# Patient Record
Sex: Male | Born: 1955 | Race: White | Hispanic: No | Marital: Married | Smoking: Former smoker
Health system: Southern US, Community
[De-identification: ages and names within clinical notes are randomized; demographics above are authoritative.]

## PROBLEM LIST (undated history)

## (undated) DIAGNOSIS — N400 Enlarged prostate without lower urinary tract symptoms: Secondary | ICD-10-CM

## (undated) DIAGNOSIS — M109 Gout, unspecified: Secondary | ICD-10-CM

## (undated) DIAGNOSIS — M1990 Unspecified osteoarthritis, unspecified site: Secondary | ICD-10-CM

## (undated) DIAGNOSIS — E785 Hyperlipidemia, unspecified: Secondary | ICD-10-CM

## (undated) DIAGNOSIS — M549 Dorsalgia, unspecified: Secondary | ICD-10-CM

## (undated) HISTORY — DX: Dorsalgia, unspecified: M54.9

## (undated) HISTORY — PX: COLONOSCOPY: SHX174

## (undated) HISTORY — DX: Gout, unspecified: M10.9

---

## 2002-04-23 HISTORY — PX: FOOT ARTHROPLASTY: SHX1657

## 2003-09-28 ENCOUNTER — Ambulatory Visit (HOSPITAL_COMMUNITY): Admission: RE | Admit: 2003-09-28 | Discharge: 2003-09-28 | Payer: Self-pay | Admitting: Anesthesiology

## 2009-06-19 ENCOUNTER — Emergency Department (HOSPITAL_COMMUNITY): Admission: EM | Admit: 2009-06-19 | Discharge: 2009-06-19 | Payer: Self-pay | Admitting: Emergency Medicine

## 2012-04-23 HISTORY — PX: WRIST ARTHRODESIS: SUR65

## 2012-10-22 ENCOUNTER — Other Ambulatory Visit: Payer: Self-pay | Admitting: Orthopedic Surgery

## 2012-10-22 DIAGNOSIS — M25532 Pain in left wrist: Secondary | ICD-10-CM

## 2012-10-30 ENCOUNTER — Ambulatory Visit
Admission: RE | Admit: 2012-10-30 | Discharge: 2012-10-30 | Disposition: A | Discharge status: Home / Self Care | Payer: Worker's Compensation | Source: Ambulatory Visit | Attending: Orthopedic Surgery | Admitting: Orthopedic Surgery

## 2012-10-30 ENCOUNTER — Other Ambulatory Visit: Payer: Self-pay | Admitting: Orthopedic Surgery

## 2012-10-30 DIAGNOSIS — M25532 Pain in left wrist: Secondary | ICD-10-CM

## 2012-10-30 DIAGNOSIS — Z77018 Contact with and (suspected) exposure to other hazardous metals: Secondary | ICD-10-CM

## 2012-10-30 MED ORDER — IOHEXOL 180 MG/ML  SOLN
3.0000 mL | Freq: Once | INTRAMUSCULAR | Status: AC | PRN
  Administered 2012-10-30: 3 mL via INTRA_ARTICULAR

## 2012-11-03 ENCOUNTER — Other Ambulatory Visit: Payer: Self-pay | Admitting: Orthopedic Surgery

## 2012-11-03 DIAGNOSIS — M25532 Pain in left wrist: Secondary | ICD-10-CM

## 2012-11-11 ENCOUNTER — Ambulatory Visit
Admission: RE | Admit: 2012-11-11 | Discharge: 2012-11-11 | Disposition: A | Discharge status: Home / Self Care | Payer: Worker's Compensation | Source: Ambulatory Visit | Attending: Orthopedic Surgery | Admitting: Orthopedic Surgery

## 2012-11-11 ENCOUNTER — Other Ambulatory Visit: Payer: Self-pay | Admitting: Orthopedic Surgery

## 2012-11-11 DIAGNOSIS — M25532 Pain in left wrist: Secondary | ICD-10-CM

## 2012-11-11 DIAGNOSIS — Z77018 Contact with and (suspected) exposure to other hazardous metals: Secondary | ICD-10-CM

## 2012-11-11 MED ORDER — IOHEXOL 180 MG/ML  SOLN
3.0000 mL | Freq: Once | INTRAMUSCULAR | Status: AC | PRN
  Administered 2012-11-11: 1.5 mL via INTRA_ARTICULAR

## 2013-01-19 ENCOUNTER — Other Ambulatory Visit: Payer: Self-pay | Admitting: Orthopedic Surgery

## 2013-02-12 ENCOUNTER — Encounter (HOSPITAL_BASED_OUTPATIENT_CLINIC_OR_DEPARTMENT_OTHER): Payer: Self-pay | Admitting: *Deleted

## 2013-02-12 NOTE — Progress Notes (Signed)
Pt denies any cardiac problems-or sleep apnea-does snore-had this wrist surgery [place] 1/14-did not have any resp problems post op

## 2013-02-12 NOTE — Progress Notes (Signed)
02/12/13 1443  OBSTRUCTIVE SLEEP APNEA  Have you ever been diagnosed with sleep apnea through a sleep study? No  Do you snore loudly (loud enough to be heard through closed doors)?  1  Do you often feel tired, fatigued, or sleepy during the daytime? 0  Has anyone observed you stop breathing during your sleep? 0  Do you have, or are you being treated for high blood pressure? 0  BMI more than 35 kg/m2? 1  Age over 55 years old? 1  Neck circumference greater than 40 cm/18 inches? (does not know)  Gender: 1  Obstructive Sleep Apnea Score 4  Score 4 or greater  Results sent to PCP

## 2013-02-17 ENCOUNTER — Ambulatory Visit (HOSPITAL_BASED_OUTPATIENT_CLINIC_OR_DEPARTMENT_OTHER)
Admission: RE | Admit: 2013-02-17 | Discharge: 2013-02-17 | Disposition: A | Discharge status: Home / Self Care | Payer: Worker's Compensation | Source: Ambulatory Visit | Attending: Orthopedic Surgery | Admitting: Orthopedic Surgery

## 2013-02-17 ENCOUNTER — Ambulatory Visit (HOSPITAL_BASED_OUTPATIENT_CLINIC_OR_DEPARTMENT_OTHER): Payer: Worker's Compensation | Admitting: Certified Registered Nurse Anesthetist

## 2013-02-17 ENCOUNTER — Encounter (HOSPITAL_BASED_OUTPATIENT_CLINIC_OR_DEPARTMENT_OTHER)
Admission: RE | Disposition: A | Discharge status: Home / Self Care | Payer: Self-pay | Source: Ambulatory Visit | Attending: Orthopedic Surgery

## 2013-02-17 ENCOUNTER — Encounter (HOSPITAL_BASED_OUTPATIENT_CLINIC_OR_DEPARTMENT_OTHER): Payer: Worker's Compensation | Admitting: Certified Registered Nurse Anesthetist

## 2013-02-17 ENCOUNTER — Encounter (HOSPITAL_BASED_OUTPATIENT_CLINIC_OR_DEPARTMENT_OTHER): Payer: Self-pay | Admitting: Orthopedic Surgery

## 2013-02-17 DIAGNOSIS — S42309S Unspecified fracture of shaft of humerus, unspecified arm, sequela: Secondary | ICD-10-CM | POA: Insufficient documentation

## 2013-02-17 DIAGNOSIS — IMO0002 Reserved for concepts with insufficient information to code with codable children: Secondary | ICD-10-CM | POA: Insufficient documentation

## 2013-02-17 DIAGNOSIS — M24639 Ankylosis, unspecified wrist: Secondary | ICD-10-CM | POA: Insufficient documentation

## 2013-02-17 HISTORY — PX: WRIST ARTHROSCOPY: SHX838

## 2013-02-17 HISTORY — PX: ULNA OSTEOTOMY: SHX1077

## 2013-02-17 HISTORY — DX: Hyperlipidemia, unspecified: E78.5

## 2013-02-17 LAB — POCT HEMOGLOBIN-HEMACUE: Hemoglobin: 13.8 g/dL (ref 13.0–17.0)

## 2013-02-17 SURGERY — ARTHROSCOPY, WRIST
Anesthesia: General | Site: Wrist | Laterality: Left | Wound class: Clean

## 2013-02-17 MED ORDER — FENTANYL CITRATE 0.05 MG/ML IJ SOLN
INTRAMUSCULAR | Status: AC
  Filled 2013-02-17: qty 2

## 2013-02-17 MED ORDER — MIDAZOLAM HCL 2 MG/2ML IJ SOLN
INTRAMUSCULAR | Status: AC
  Filled 2013-02-17: qty 2

## 2013-02-17 MED ORDER — CHLORHEXIDINE GLUCONATE 4 % EX LIQD: 60.0000 mL | Freq: Once | CUTANEOUS | Status: DC

## 2013-02-17 MED ORDER — CEFAZOLIN SODIUM-DEXTROSE 2-3 GM-% IV SOLR
INTRAVENOUS | Status: AC
  Filled 2013-02-17: qty 50

## 2013-02-17 MED ORDER — OXYCODONE HCL 5 MG/5ML PO SOLN: 5.0000 mg | Freq: Once | ORAL | Status: AC | PRN

## 2013-02-17 MED ORDER — CEFAZOLIN SODIUM-DEXTROSE 2-3 GM-% IV SOLR
2.0000 g | INTRAVENOUS | Status: AC
  Administered 2013-02-17: 2 g via INTRAVENOUS

## 2013-02-17 MED ORDER — OXYCODONE HCL 5 MG PO TABS
10.0000 mg | ORAL_TABLET | Freq: Once | ORAL | Status: AC | PRN
  Administered 2013-02-17: 10 mg via ORAL

## 2013-02-17 MED ORDER — OXYCODONE-ACETAMINOPHEN 10-325 MG PO TABS: 1.0000 | ORAL_TABLET | ORAL | Status: DC | PRN

## 2013-02-17 MED ORDER — OXYCODONE HCL 5 MG PO TABS
ORAL_TABLET | ORAL | Status: AC
  Filled 2013-02-17: qty 2

## 2013-02-17 MED ORDER — FENTANYL CITRATE 0.05 MG/ML IJ SOLN
INTRAMUSCULAR | Status: AC
  Filled 2013-02-17: qty 6

## 2013-02-17 MED ORDER — ONDANSETRON HCL 4 MG/2ML IJ SOLN: 4.0000 mg | Freq: Once | INTRAMUSCULAR | Status: DC | PRN

## 2013-02-17 MED ORDER — OXYCODONE HCL 5 MG/5ML PO SOLN: 5.0000 mg | Freq: Once | ORAL | Status: DC | PRN

## 2013-02-17 MED ORDER — DEXAMETHASONE SODIUM PHOSPHATE 4 MG/ML IJ SOLN
INTRAMUSCULAR | Status: DC | PRN
  Administered 2013-02-17: 4 mg

## 2013-02-17 MED ORDER — MIDAZOLAM HCL 2 MG/2ML IJ SOLN
1.0000 mg | INTRAMUSCULAR | Status: DC | PRN
  Administered 2013-02-17 (×2): 2 mg via INTRAVENOUS

## 2013-02-17 MED ORDER — HYDROMORPHONE HCL PF 1 MG/ML IJ SOLN
0.2500 mg | INTRAMUSCULAR | Status: DC | PRN
  Administered 2013-02-17 (×2): 0.5 mg via INTRAVENOUS

## 2013-02-17 MED ORDER — MIDAZOLAM HCL 5 MG/5ML IJ SOLN
INTRAMUSCULAR | Status: DC | PRN
  Administered 2013-02-17: 1 mg via INTRAVENOUS

## 2013-02-17 MED ORDER — FENTANYL CITRATE 0.05 MG/ML IJ SOLN
50.0000 ug | INTRAMUSCULAR | Status: DC | PRN
  Administered 2013-02-17: 100 ug via INTRAVENOUS

## 2013-02-17 MED ORDER — OXYCODONE HCL 5 MG PO TABS: 5.0000 mg | ORAL_TABLET | Freq: Once | ORAL | Status: DC | PRN

## 2013-02-17 MED ORDER — LACTATED RINGERS IV SOLN
INTRAVENOUS | Status: DC
  Administered 2013-02-17 (×2): via INTRAVENOUS

## 2013-02-17 MED ORDER — DEXAMETHASONE SODIUM PHOSPHATE 10 MG/ML IJ SOLN
INTRAMUSCULAR | Status: DC | PRN
  Administered 2013-02-17: 10 mg via INTRAVENOUS

## 2013-02-17 MED ORDER — FENTANYL CITRATE 0.05 MG/ML IJ SOLN
INTRAMUSCULAR | Status: AC
  Filled 2013-02-17: qty 4

## 2013-02-17 MED ORDER — PROPOFOL 10 MG/ML IV BOLUS
INTRAVENOUS | Status: AC
  Filled 2013-02-17: qty 20

## 2013-02-17 MED ORDER — BUPIVACAINE-EPINEPHRINE PF 0.5-1:200000 % IJ SOLN
INTRAMUSCULAR | Status: DC | PRN
  Administered 2013-02-17: 24 mL

## 2013-02-17 MED ORDER — HYDROMORPHONE HCL PF 1 MG/ML IJ SOLN
INTRAMUSCULAR | Status: AC
  Filled 2013-02-17: qty 1

## 2013-02-17 MED ORDER — FENTANYL CITRATE 0.05 MG/ML IJ SOLN
INTRAMUSCULAR | Status: DC | PRN
  Administered 2013-02-17 (×2): 25 ug via INTRAVENOUS
  Administered 2013-02-17: 50 ug via INTRAVENOUS
  Administered 2013-02-17: 25 ug via INTRAVENOUS

## 2013-02-17 MED ORDER — PROPOFOL 10 MG/ML IV BOLUS
INTRAVENOUS | Status: DC | PRN
  Administered 2013-02-17: 240 mg via INTRAVENOUS

## 2013-02-17 MED ORDER — LIDOCAINE HCL (CARDIAC) 20 MG/ML IV SOLN
INTRAVENOUS | Status: DC | PRN
  Administered 2013-02-17: 30 mg via INTRAVENOUS

## 2013-02-17 SURGICAL SUPPLY — 98 items
BANDAGE GAUZE ELAST BULKY 4 IN (GAUZE/BANDAGES/DRESSINGS) ×2 IMPLANT
BIT DRILL 2.8X5 QR DISP (BIT) ×1 IMPLANT
BIT DRILL QUICK RELEASE 3.5MM (BIT) IMPLANT
BLADE AVERAGE 25X9 (BLADE) ×2 IMPLANT
BLADE CUDA 2.0 (BLADE) IMPLANT
BLADE EAR TYMPAN 2.5 60D BEAV (BLADE) IMPLANT
BLADE MINI RND TIP GREEN BEAV (BLADE) IMPLANT
BLADE SAW OSTEOTOMY (BLADE) ×1 IMPLANT
BLADE SURG 15 STRL LF DISP TIS (BLADE) ×1 IMPLANT
BLADE SURG 15 STRL SS (BLADE) ×2
BNDG CMPR 9X4 STRL LF SNTH (GAUZE/BANDAGES/DRESSINGS) ×1
BNDG COHESIVE 3X5 TAN STRL LF (GAUZE/BANDAGES/DRESSINGS) ×4 IMPLANT
BNDG ESMARK 4X9 LF (GAUZE/BANDAGES/DRESSINGS) ×2 IMPLANT
BONE EVOLUTION TRINITY 1CC (Bone Implant) ×1 IMPLANT
BUR CUDA 2.9 (BURR) IMPLANT
BUR EGG 3PK/BX (BURR) IMPLANT
BUR FULL RADIUS 2.0 (BURR) IMPLANT
BUR FULL RADIUS 2.9 (BURR) ×1 IMPLANT
BUR GATOR 2.9 (BURR) IMPLANT
BUR SPHERICAL 2.9 (BURR) IMPLANT
CANISTER OMNI JUG 16 LITER (MISCELLANEOUS) IMPLANT
CANISTER SUCT 1200ML W/VALVE (MISCELLANEOUS) ×1 IMPLANT
CANISTER SUCT 3000ML (MISCELLANEOUS) IMPLANT
CHLORAPREP W/TINT 26ML (MISCELLANEOUS) ×2 IMPLANT
CORDS BIPOLAR (ELECTRODE) ×2 IMPLANT
COVER MAYO STAND STRL (DRAPES) ×2 IMPLANT
COVER TABLE BACK 60X90 (DRAPES) ×2 IMPLANT
CUFF TOURNIQUET SINGLE 18IN (TOURNIQUET CUFF) ×1 IMPLANT
DECANTER SPIKE VIAL GLASS SM (MISCELLANEOUS) IMPLANT
DRAIN PENROSE 1/2X12 LTX STRL (WOUND CARE) IMPLANT
DRAPE EXTREMITY T 121X128X90 (DRAPE) ×2 IMPLANT
DRAPE OEC MINIVIEW 54X84 (DRAPES) ×2 IMPLANT
DRAPE SURG 17X23 STRL (DRAPES) ×2 IMPLANT
DRAPE U 20/CS (DRAPES) ×2 IMPLANT
DRILL QUICK RELEASE 3.5MM (BIT) ×2
DRSG KUZMA FLUFF (GAUZE/BANDAGES/DRESSINGS) IMPLANT
ELECT SMALL JOINT 90D BASC (ELECTRODE) IMPLANT
GAUZE SPONGE 4X4 16PLY XRAY LF (GAUZE/BANDAGES/DRESSINGS) ×2 IMPLANT
GAUZE XEROFORM 1X8 LF (GAUZE/BANDAGES/DRESSINGS) ×2 IMPLANT
GLOVE BIO SURGEON STRL SZ 6.5 (GLOVE) ×2 IMPLANT
GLOVE BIOGEL PI IND STRL 8.5 (GLOVE) ×1 IMPLANT
GLOVE BIOGEL PI INDICATOR 8.5 (GLOVE) ×1
GLOVE SURG ORTHO 8.0 STRL STRW (GLOVE) ×2 IMPLANT
GOWN BRE IMP PREV XXLGXLNG (GOWN DISPOSABLE) ×3 IMPLANT
GOWN PREVENTION PLUS XLARGE (GOWN DISPOSABLE) ×3 IMPLANT
GUIDEWIRE ORTHO 0.054X6 (WIRE) ×2 IMPLANT
KIT MINI BIO ANCHOR DRILL (KITS) IMPLANT
NDL EPIDURAL TUOHY 20GX3.5 (NEEDLE) IMPLANT
NDL SAFETY ECLIPSE 18X1.5 (NEEDLE) ×3 IMPLANT
NDL SPNL 18GX3.5 QUINCKE PK (NEEDLE) IMPLANT
NEEDLE HYPO 18GX1.5 SHARP (NEEDLE) ×2
NEEDLE HYPO 22GX1.5 SAFETY (NEEDLE) ×2 IMPLANT
NEEDLE SPNL 18GX3.5 QUINCKE PK (NEEDLE) IMPLANT
NEEDLE TUOHY 20GX3.5 (NEEDLE) IMPLANT
NS IRRIG 1000ML POUR BTL (IV SOLUTION) ×1 IMPLANT
PACK BASIN DAY SURGERY FS (CUSTOM PROCEDURE TRAY) ×2 IMPLANT
PAD CAST 3X4 CTTN HI CHSV (CAST SUPPLIES) ×1 IMPLANT
PAD CAST 4YDX4 CTTN HI CHSV (CAST SUPPLIES) ×1 IMPLANT
PADDING CAST ABS 3INX4YD NS (CAST SUPPLIES) ×1
PADDING CAST ABS 4INX4YD NS (CAST SUPPLIES) ×1
PADDING CAST ABS COTTON 3X4 (CAST SUPPLIES) ×1 IMPLANT
PADDING CAST ABS COTTON 4X4 ST (CAST SUPPLIES) ×1 IMPLANT
PADDING CAST COTTON 3X4 STRL (CAST SUPPLIES) ×2
PADDING CAST COTTON 4X4 STRL (CAST SUPPLIES) ×2
PLATE ULNAR SHORTENING (Plate) ×1 IMPLANT
ROUTER HOODED VORTEX 2.9MM (BLADE) IMPLANT
SCREW CANC 18MM (Screw) ×1 IMPLANT
SCREW HEXALOBE NON-LOCK 3.5X14 (Screw) ×2 IMPLANT
SCREW HEXALOBE NON-LOCK 3.5X16 (Screw) ×3 IMPLANT
SCREW NONLOCK HEX 3.5X12 (Screw) ×1 IMPLANT
SET ARTHROSCOPY TUBING (MISCELLANEOUS) ×2
SET ARTHROSCOPY TUBING LN (MISCELLANEOUS) IMPLANT
SET EXT MALE ROTATING LL 32IN (MISCELLANEOUS) ×2 IMPLANT
SET IV EXT TUBING FEMALE 31 (MISCELLANEOUS) ×1 IMPLANT
SET SM JOINT TUBING/CANN (CANNULA) IMPLANT
SLEEVE SCD COMPRESS KNEE MED (MISCELLANEOUS) ×1 IMPLANT
SLING ARM FOAM STRAP LRG (SOFTGOODS) IMPLANT
SLING ARM FOAM STRAP MED (SOFTGOODS) IMPLANT
SPLINT PLASTER CAST XFAST 3X15 (CAST SUPPLIES) IMPLANT
SPLINT PLASTER XTRA FASTSET 3X (CAST SUPPLIES) ×30
SPONGE GAUZE 4X4 12PLY (GAUZE/BANDAGES/DRESSINGS) ×2 IMPLANT
STOCKINETTE 4X48 STRL (DRAPES) ×2 IMPLANT
SUCTION FRAZIER TIP 10 FR DISP (SUCTIONS) IMPLANT
SUT MERSILENE 4 0 P 3 (SUTURE) IMPLANT
SUT PDS AB 2-0 CT2 27 (SUTURE) IMPLANT
SUT STEEL 4 0 (SUTURE) IMPLANT
SUT VIC AB 2-0 PS2 27 (SUTURE) ×2 IMPLANT
SUT VICRYL 4-0 PS2 18IN ABS (SUTURE) ×2 IMPLANT
SUT VICRYL RAPID 5 0 P 3 (SUTURE) IMPLANT
SUT VICRYL RAPIDE 4/0 PS 2 (SUTURE) ×2 IMPLANT
SYR BULB 3OZ (MISCELLANEOUS) ×2 IMPLANT
SYR CONTROL 10ML LL (SYRINGE) ×2 IMPLANT
TOWEL OR 17X24 6PK STRL BLUE (TOWEL DISPOSABLE) ×2 IMPLANT
TUBE CONNECTING 20X1/4 (TUBING) IMPLANT
UNDERPAD 30X30 INCONTINENT (UNDERPADS AND DIAPERS) ×2 IMPLANT
WAND 1.5 MICROBLATOR (SURGICAL WAND) IMPLANT
WATER STERILE IRR 1000ML POUR (IV SOLUTION) ×2 IMPLANT
WIRE TACK PLATE PL-PTACK (WIRE) ×1 IMPLANT

## 2013-02-17 NOTE — Op Note (Signed)
Dictation Number [id number]

## 2013-02-17 NOTE — Anesthesia Preprocedure Evaluation (Signed)

## 2013-02-17 NOTE — H&P (Signed)
Brian Russo is a 57 year-old right-hand dominant male with pain in his left hand and wrist, inability to fully flex/extend his wrist and fingers.  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 The injury occurred on 05/15/12.  He doesn't remember the actual fall. He was treated with an external fixator for eight weeks.  He suffered an infection of the distal pin. This was removed.  He has had therapy, but has been unable to be rehabilitated. He complains of a constant, moderate aching pain with a feeling of weakness.  He complains of pain on the ulnar aspect of his wrist.  He is awakened 7:7 nights with a funny feeling in his hand like it doesn't work the way it is supposed to.  Activity and work make this worse.  Elevation and rest have helped. He is working at the present time.  He has been wearing a brace. He has no prior history of injury.  He is not taking anything for this. He takes Lortab for his back.  He has history of arthritis, no history of diabetes, thyroid disease or gout.  He complains primarily of a feeling of soreness.  He has no similar symptoms on his right dominant hand.  ALLERGIES:     None.  MEDICATIONS:     Lortab.  SURGICAL HISTORY:    Broken foot treated in 2003.   FAMILY MEDICAL HISTORY:    Negative.  SOCIAL HISTORY:    He does not smoke, drinks socially, he is married and a Corporate investment banker.   REVIEW OF SYSTEMS:    Negative 14 points.  Brian Russo is an 57 y.o. male.   Chief Complaint: Ulnocarpal abutment post distal radius fracture left HPI: see above  Past Medical History  Diagnosis Date  . Hyperlipemia     Past Surgical History  Procedure Laterality Date  . Wrist arthrodesis  1/14    [place]  . Foot arthroplasty  2004    repair fx rt foot  . Colonoscopy      History reviewed. No pertinent family history. Social History:  reports that he quit smoking about 32 years ago. He does not have  any smokeless tobacco history on file. He reports that he drinks alcohol. He reports that he does not use illicit drugs.  Allergies: No Known Allergies  No prescriptions prior to admission    No results found for this or any previous visit (from the past 48 hour(s)).  No results found.   Pertinent items are noted in HPI.  Height 5\' 8"  (1.727 m), weight 233 lb (105.688 kg).  General appearance: alert, cooperative and appears stated age Head: Normocephalic, without obvious abnormality Neck: no JVD Resp: clear to auscultation bilaterally Cardio: regular rate and rhythm, S1, S2 normal, no murmur, click, rub or gallop GI: soft, non-tender; bowel sounds normal; no masses,  no organomegaly Extremities: extremities normal, atraumatic, no cyanosis or edema Pulses: 2+ and symmetric Skin: Skin color, texture, turgor normal. No rashes or lesions Neurologic: Grossly normal Incision/Wound: na  Assessment/Plan RADIOGRAPHS:    X-rays are reviewed including his original CT scan revealing a comminuted intra-articular fracture of his left distal radius treated with an external fixator.    Repeat x-rays today reveal that the fracture healed, there is some malalignment of the interarticular components, the exact step-off is difficult to determine. He does show a long ulna as compared to his radius. He is in a neutral  tilt.   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   He would like to proceed to having this looked at arthroscopically with shortening of his distal ulnar in an effort to see if this will resolve symptoms to the point that he is able to work with it. He is aware that the pain may be coming from his distal radius fracture. It may be due to irregularity in the joint and that a fusion of the radiocarpal joint may be necessary. We will proceed with arthroscopy and debridement with ulnar shortening osteotomy in  an effort to see if this will buy time and allowing him to return to work. We will plan on bone grafting this with Trinity at the time of surgery. He is advised that the plate may need to be removed. He is advised of delayed or nonunion, injury to arteries, nerves and tendons. He would like to proceed. This is to his left wrist  Jeyli Zwicker R 02/17/2013, 7:45 AM

## 2013-02-17 NOTE — Anesthesia Procedure Notes (Addendum)
Anesthesia Regional Block:  Supraclavicular block  Pre-Anesthetic Checklist: ,, timeout performed, Correct Patient, Correct Site, Correct Laterality, Correct Procedure, Correct Position, site marked, Risks and benefits discussed,  Surgical consent,  Pre-op evaluation,  At surgeon's request and post-op pain management  Laterality: Left and Upper  Prep: chloraprep       Needles:  Injection technique: Single-shot  Needle Type: Echogenic Stimulator Needle     Needle Length: 5cm 5 cm Needle Gauge: 21 and 21 G    Additional Needles:  Procedures: ultrasound guided (picture in chart) Supraclavicular block Narrative:  Start time: 02/17/2013 10:47 AM End time: 02/17/2013 10:55 AM Injection made incrementally with aspirations every 5 mL.  Performed by: Personally  Anesthesiologist: Sheldon Silvan  Supraclavicular block Procedure Name: LMA Insertion Date/Time: 02/17/2013 12:01 PM Performed by: Nicki Reaper Pre-anesthesia Checklist: Patient identified, Emergency Drugs available, Suction available and Patient being monitored Patient Re-evaluated:Patient Re-evaluated prior to inductionOxygen Delivery Method: Circle System Utilized Preoxygenation: Pre-oxygenation with 100% oxygen Intubation Type: IV induction Ventilation: Mask ventilation without difficulty LMA: LMA inserted LMA Size: 5.0 Number of attempts: 1 Airway Equipment and Method: bite block Placement Confirmation: positive ETCO2 Tube secured with: Tape Dental Injury: Teeth and Oropharynx as per pre-operative assessment

## 2013-02-17 NOTE — Brief Op Note (Signed)
02/17/2013  1:59 PM  PATIENT:  Velora Heckler Sieling  57 y.o. male  PRE-OPERATIVE DIAGNOSIS:  STATUS POST FRACTURE DISTAL RADIUS ULNO-CARPAL ABATMENT LEFT  POST-OPERATIVE DIAGNOSIS:  STATUS POST FRACTURE DISTAL RADIUS ULNO-CARPAL ABATMENT LEFT  PROCEDURE:  Procedure(s): LEFT WRIST ARTHROSCOPY WITH DEBRIDEMENT AND ULNA SHORTENING OSTEOTOMY LEFT (Left) ULNAR SHORTENING (Left)  SURGEON:  Surgeon(s) and Role:    * Nicki Reaper, MD - Primary    * Tami Ribas, MD - Assisting  PHYSICIAN ASSISTANT:   ASSISTANTS: K Shandale Malak,MD   ANESTHESIA:   regional and general  EBL:  Total I/O In: 1000 [I.V.:1000] Out: -   BLOOD ADMINISTERED:none  DRAINS: none   LOCAL MEDICATIONS USED:  NONE  SPECIMEN:  No Specimen  DISPOSITION OF SPECIMEN:  N/A  COUNTS:  YES  TOURNIQUET:   Total Tourniquet Time Documented: Upper Arm (Left) - 60 minutes Total: Upper Arm (Left) - 60 minutes   DICTATION: .Other Dictation: Dictation Number [id number]  PLAN OF CARE: Discharge to home after PACU  PATIENT DISPOSITION:  PACU - hemodynamically stable.

## 2013-02-17 NOTE — Anesthesia Postprocedure Evaluation (Signed)
  Anesthesia Post-op Note  Patient: Brian Russo  Procedure(s) Performed: Procedure(s): LEFT WRIST ARTHROSCOPY WITH DEBRIDEMENT AND ULNA SHORTENING OSTEOTOMY LEFT (Left) ULNAR SHORTENING (Left)  Patient Location: PACU  Anesthesia Type:GA combined with regional for post-op pain  Level of Consciousness: awake, alert  and oriented  Airway and Oxygen Therapy: Patient Spontanous Breathing  Post-op Pain: mild  Post-op Assessment: Post-op Vital signs reviewed  Post-op Vital Signs: Reviewed  Complications: No apparent anesthesia complications

## 2013-02-17 NOTE — Transfer of Care (Signed)
Immediate Anesthesia Transfer of Care Note  Patient: Brian Russo  Procedure(s) Performed: Procedure(s): LEFT WRIST ARTHROSCOPY WITH DEBRIDEMENT AND ULNA SHORTENING OSTEOTOMY LEFT (Left) ULNAR SHORTENING (Left)  Patient Location: PACU  Anesthesia Type:GA combined with regional for post-op pain  Level of Consciousness: awake and patient cooperative  Airway & Oxygen Therapy: Patient Spontanous Breathing and Patient connected to face mask oxygen  Post-op Assessment: Report given to PACU RN and Post -op Vital signs reviewed and stable  Post vital signs: Reviewed and stable  Complications: No apparent anesthesia complications

## 2013-02-18 NOTE — Op Note (Signed)
NAME:  Brian Russo, Brian Russo NO.:  [id number]  MEDICAL RECORD NO.:  [id number]  LOCATION:                                 FACILITY:  PHYSICIAN:  Cindee Salt, M.D.            DATE OF BIRTH:  DATE OF PROCEDURE:  02/17/2013 DATE OF DISCHARGE:                              OPERATIVE REPORT   PREOPERATIVE DIAGNOSIS:  Status post distal radius fracture, treated with external fixation with ulnocarpal abutment secondary to settling of the distal radius fracture and lengthening functionally of the ulna.  He has elected to undergo arthroscopic inspection, debridement with ulnar shortening osteotomy of his left ulna, arthroscopy of his left wrist.  Pre, peri, and postoperative course have been discussed along with risks and complications.  He is aware that there is no guarantee with the surgery; possibility of infection; recurrence of injury to arteries, nerves, tendons; incomplete relief of symptoms; dystrophy; possibility of nonunion; delayed union of the osteotomy site.  With further surgery being necessary, the possibility of failure of the device with removal at some point in the future, the plate and screws applying.  In the preoperative area, the patient is seen, the extremity marked by both patient and surgeon, and antibiotic given.  ANESTHESIOLOGIST:  Sheldon Silvan, M.D.  ANESTHESIA:  Supraclavicular block with general.  PROCEDURE IN DETAIL:  The patient was brought to the operating room where a supraclavicular block general anesthetic was carried out without difficulty under the direction of Dr. Ivin Booty.  He was prepped using ChloraPrep, supine position, left arm free.  A 3-minute dry-time was allowed.  Time-out taken, confirming patient and procedure.  The wrist arthroscopy was performed first.  He was placed in the arthroscopy tower, 10 pounds of traction applied.  The joint inflated through the 3.4 portal.  A transverse incision was made, deepened with a  hemostat. Blunt trocar was used to enter the joint.  The joint was inspected.  The scapholunate ligament appeared to be intact along with the articular surface on the radial aspect that the scope could not be passed to the ulnar side due to the significant scarring between the distal radius and the carpal bones.  A 4.5 portal was opened.  An irrigation catheter placed in 6U.  The blunt probe was inserted, this was allowed to breakthrough the scar on the radial side allowing visualization.  A full radius shaver was then inserted and a lysis of arthrofibrosis was then performed.  Significant changes were present on the articular surface of the distal radius.  There was chondromalacia present on the surface of the lunate secondary to the scarring from the distal radius to the lunate.  The TFCC was noted to be torn with a ulnar head being quite difficult in the radiocarpal joint.  The scope was introduced in 4-5, significant changes were present on the ulnar aspect of the lunate secondary to the abutment.  Further lysis of scar tissue on the radial side was performed, smoothing in the articular surface done as much as possible with full radius shape.  The instruments were removed.  It was decided to proceed with ulnar shortening osteotomy.  The portals were closed after  removal of the instrumentation.  The limb was exsanguinated with an Esmarch bandage.  Tourniquet was placed on the upper arm was inflated to 250 mmHg.  A longitudinal incision was then made on the ulnar aspect of the forearm, carried down through the subcutaneous tissue.  Bleeders were electrocauterized with bipolar.  The dorsal sensory branch of the ulnar nerve was looked for, but was not seen in the incision.  The dissection carried down between the extensor carpi ulnaris and flexor carpi ulnaris.  The periosteum was incised.  The periosteum was elevated.  An Acumed ulnar shortening plate was then placed on the volar aspect  of the ulna.  This was clamped.  Drill holes were placed for placement of the most distal screw, this was inserted to 12 mm.  The alignment screw was then placed proximally.  Two distal drill holes were then drilled with 2.5 drill bit.  Pins were inserted along with the alignment tang proximally.  The osteotomy guide was then inserted, pinned in position and a dual cut was made 4 and 1.5 mm.  This allowed the area to be compressed during the cutting of the bone, this was thoroughly irrigated with saline.  The proximal pins were removed. The compression guide was placed, this allowed the osteotomy site to be very firmly compressed and aligned.  The more proximal screw was then placed.  This measured 14 mm.  The oblique screw was then inserted and then unfortunately, the 3.5 drill bit was used and penetrated both cortices.  A 4-mm screw was then inserted, this measured 18 mm, firmly compressing the osteotomy site and stabilizing it.  The remaining screws were placed, these measured between 14 and 16 mm.  X-rays confirmed positioning of the osteotomy site in good position.  The oblique screws maintained.  The osteotomy site was then bone grafted with Trinity bone graft, 1 mL was used.  This was mixed for 10 minutes.  After defrosting, the wound was irrigated prior placing the bone graft.  The periosteum was repaired over this much as possible with a running 2-0 Vicryl suture.  The fascia was repaired in the subcutaneous tissue with interrupted 4-0 Vicryl and the skin with subcuticular 4-0 Vicryl Rapide. Sterile compressive dressing, [place] splint was applied.  On deflation of the tourniquet, all fingers were immediately pinked.  He was taken to the recovery room for observation in satisfactory condition.  He will be discharged to home to return in 1 week, on Percocet.          ______________________________ Cindee Salt, M.D.     GK/MEDQ  D:  02/17/2013  T:  02/18/2013  Job:  [id number]

## 2013-02-19 ENCOUNTER — Encounter (HOSPITAL_BASED_OUTPATIENT_CLINIC_OR_DEPARTMENT_OTHER): Payer: Self-pay | Admitting: Orthopedic Surgery

## 2014-03-23 ENCOUNTER — Emergency Department (HOSPITAL_COMMUNITY)
Admission: EM | Admit: 2014-03-23 | Discharge: 2014-03-23 | Disposition: A | Discharge status: Home / Self Care | Payer: BC Managed Care – PPO | Attending: Emergency Medicine | Admitting: Emergency Medicine

## 2014-03-23 ENCOUNTER — Emergency Department (HOSPITAL_COMMUNITY): Payer: BC Managed Care – PPO

## 2014-03-23 ENCOUNTER — Encounter (HOSPITAL_COMMUNITY): Payer: Self-pay | Admitting: Emergency Medicine

## 2014-03-23 DIAGNOSIS — S46911A Strain of unspecified muscle, fascia and tendon at shoulder and upper arm level, right arm, initial encounter: Secondary | ICD-10-CM | POA: Diagnosis not present

## 2014-03-23 DIAGNOSIS — Y9389 Activity, other specified: Secondary | ICD-10-CM | POA: Diagnosis not present

## 2014-03-23 DIAGNOSIS — S4991XA Unspecified injury of right shoulder and upper arm, initial encounter: Secondary | ICD-10-CM | POA: Diagnosis present

## 2014-03-23 DIAGNOSIS — W010XXA Fall on same level from slipping, tripping and stumbling without subsequent striking against object, initial encounter: Secondary | ICD-10-CM | POA: Insufficient documentation

## 2014-03-23 DIAGNOSIS — Z87891 Personal history of nicotine dependence: Secondary | ICD-10-CM | POA: Insufficient documentation

## 2014-03-23 DIAGNOSIS — Y998 Other external cause status: Secondary | ICD-10-CM | POA: Diagnosis not present

## 2014-03-23 DIAGNOSIS — Z8639 Personal history of other endocrine, nutritional and metabolic disease: Secondary | ICD-10-CM | POA: Diagnosis not present

## 2014-03-23 DIAGNOSIS — Y929 Unspecified place or not applicable: Secondary | ICD-10-CM | POA: Diagnosis not present

## 2014-03-23 MED ORDER — HYDROCODONE-ACETAMINOPHEN 5-325 MG PO TABS: 1.0000 | ORAL_TABLET | Freq: Four times a day (QID) | ORAL | Status: DC | PRN

## 2014-03-23 NOTE — ED Provider Notes (Signed)
CSN: [id number]     Arrival date & time 03/23/14  0806 History   First MD Initiated Contact with Patient 03/23/14 [phone]     Chief Complaint  Patient presents with  . Shoulder Pain     (Consider location/radiation/quality/duration/timing/severity/associated sxs/prior Treatment) HPI Comments: Has tried ibuprofen without relief  Patient is a 58 y.o. Brian Russo presenting with shoulder pain. The history is provided by the patient. No language interpreter was used.  Shoulder Pain Location:  Shoulder Time since incident:  1 week Injury: yes   Mechanism of injury: fall   Fall:    Fall occurred:  Tripped   Impact surface:  Product manager of impact: shoulder.   Entrapped after fall: no   Shoulder location:  R shoulder Pain details:    Quality:  Aching   Radiates to:  Back   Severity:  Mild   Onset quality:  Sudden   Timing:  Constant   Progression:  Unchanged Chronicity:  New Handedness:  Right-handed Dislocation: no   Associated symptoms: no decreased range of motion, no neck pain, no numbness and no tingling     Past Medical History  Diagnosis Date  . Hyperlipemia    Past Surgical History  Procedure Laterality Date  . Wrist arthrodesis  1/14    [place]  . Foot arthroplasty  2004    repair fx rt foot  . Colonoscopy    . Wrist arthroscopy Left 02/17/2013    Procedure: LEFT WRIST ARTHROSCOPY WITH DEBRIDEMENT AND ULNA SHORTENING OSTEOTOMY LEFT;  Surgeon: Wynonia Sours, MD;  Location: [facility];  Service: Orthopedics;  Laterality: Left;  . Ulna osteotomy Left 02/17/2013    Procedure: ULNAR SHORTENING;  Surgeon: Wynonia Sours, MD;  Location: [facility];  Service: Orthopedics;  Laterality: Left;   History reviewed. No pertinent family history. History  Substance Use Topics  . Smoking status: Former Smoker    Quit date: 02/12/1981  . Smokeless tobacco: Not on file  . Alcohol Use: Yes     Comment: occ    Review of Systems   Musculoskeletal: Negative for neck pain.  All other systems reviewed and are negative.     Allergies  Review of patient's allergies indicates no known allergies.  Home Medications   Prior to Admission medications   Medication Sig Start Date End Date Taking? Authorizing Provider  HYDROcodone-acetaminophen (NORCO/VICODIN) 5-325 MG per tablet Take 1-2 tablets by mouth every 6 (six) hours as needed. 03/23/14   Glendell Docker, NP   BP 149/95 mmHg  Pulse 64  Temp(Src) 97.9 F (Brian.6 C) (Oral)  Resp 18  Ht 5\' 8"  (1.727 m)  Wt 220 lb (99.791 kg)  BMI 33.46 kg/m2  SpO2 100% Physical Exam  Constitutional: He is oriented to person, place, and time. He appears well-developed and well-nourished.  Cardiovascular: Normal rate and regular rhythm.   Pulmonary/Chest: Effort normal.  Musculoskeletal:  Tender in the lateral right shoulder. Pt has full rom. No gross deformity of swelling. Full rom. Spine non tender  Neurological: He is alert and oriented to person, place, and time. Coordination normal.  Skin: Skin is warm and dry.  Nursing note and vitals reviewed.   ED Course  Procedures (including critical care time) Labs Review Labs Reviewed - No data to display  Imaging Review Dg Shoulder Right  03/23/2014   CLINICAL DATA:  Right shoulder pain since a fall on 03/13/2014.  EXAM: RIGHT SHOULDER - 2+ VIEW  COMPARISON:  None.  FINDINGS: There is no evidence of fracture or dislocation. There is no evidence of arthropathy or other focal bone abnormality. Soft tissues are unremarkable.  IMPRESSION: Normal exam.   Electronically Signed   By: Rozetta Nunnery M.D.   On: 03/23/2014 08:35     EKG Interpretation None      MDM   Final diagnoses:  Shoulder strain, right, initial encounter    No acute bony abnormality noted. Pt has full rom. Discussed need for follow up with ortho for continued symptoms. Pt give hydrocodone for pain    Glendell Docker, NP 03/23/14 2481  Brian Diego,  MD 03/23/14 [phone]

## 2014-03-23 NOTE — Discharge Instructions (Signed)

## 2014-03-23 NOTE — ED Notes (Signed)
Pt reports slipped and fell on right shoulder x1 week ago. Pt reports pain ever since. nad noted. No obvious deformity noted.

## 2014-06-30 IMAGING — RF DG FLUORO GUIDE NDL PLC/BX
3 series · 3 of 3 positions shown · non-contrast
Comparison: none

CLINICAL DATA: Left wrist pain.  Triangular fibrocartilage tear.

[Series 1: (hospital) · 1 of 1 slices shown (1 of 3)]
[im 1/1]
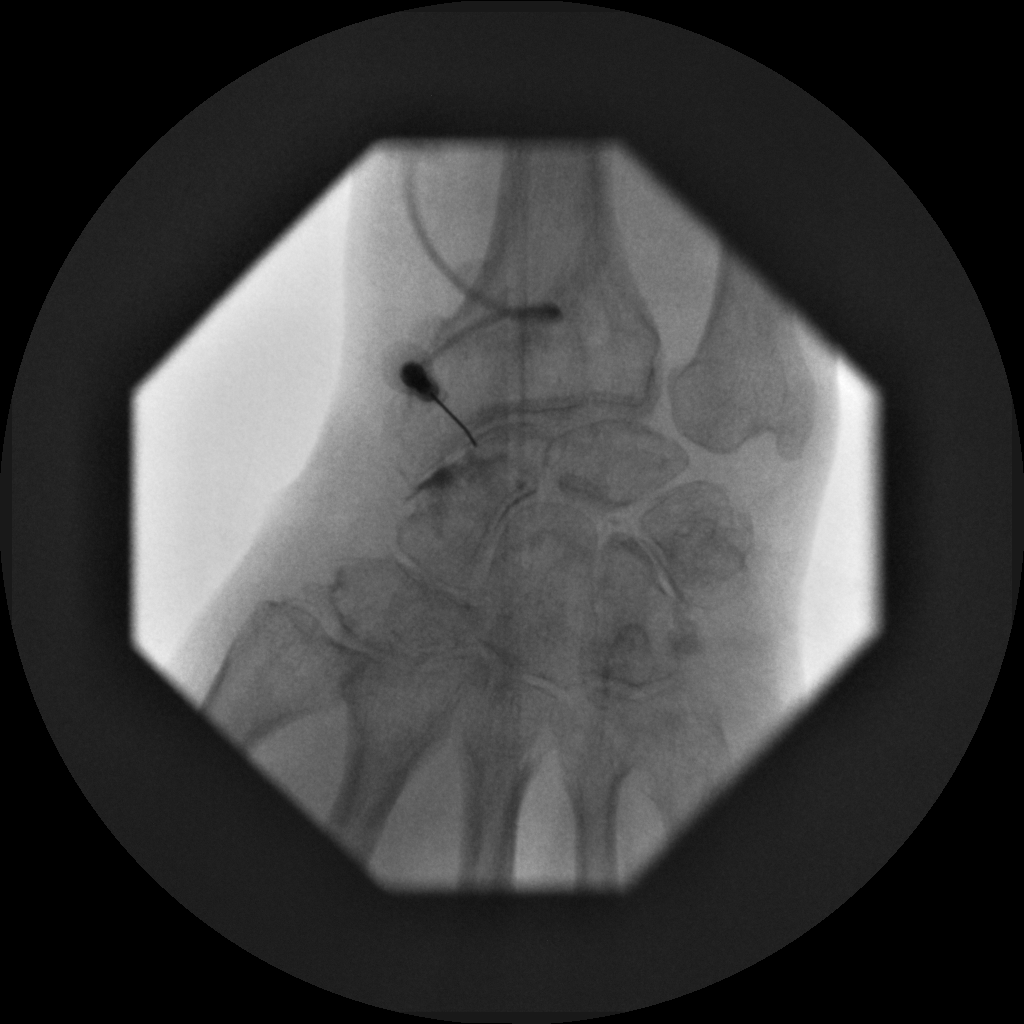

[Series 2: (hospital) · 1 of 1 slices shown (2 of 3)]
[im 1/1]
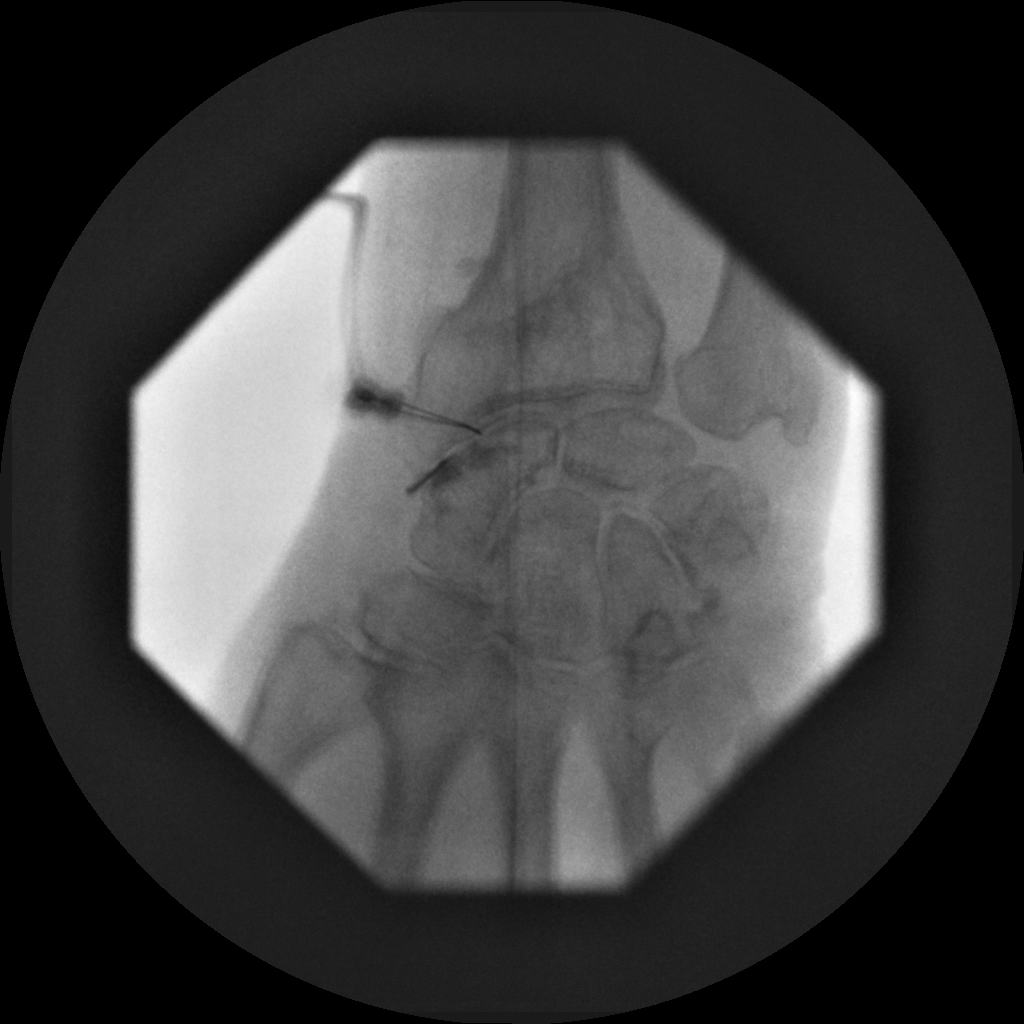

[Series 4: (hospital) · 1 of 1 slices shown (3 of 3)]
[im 1/1]
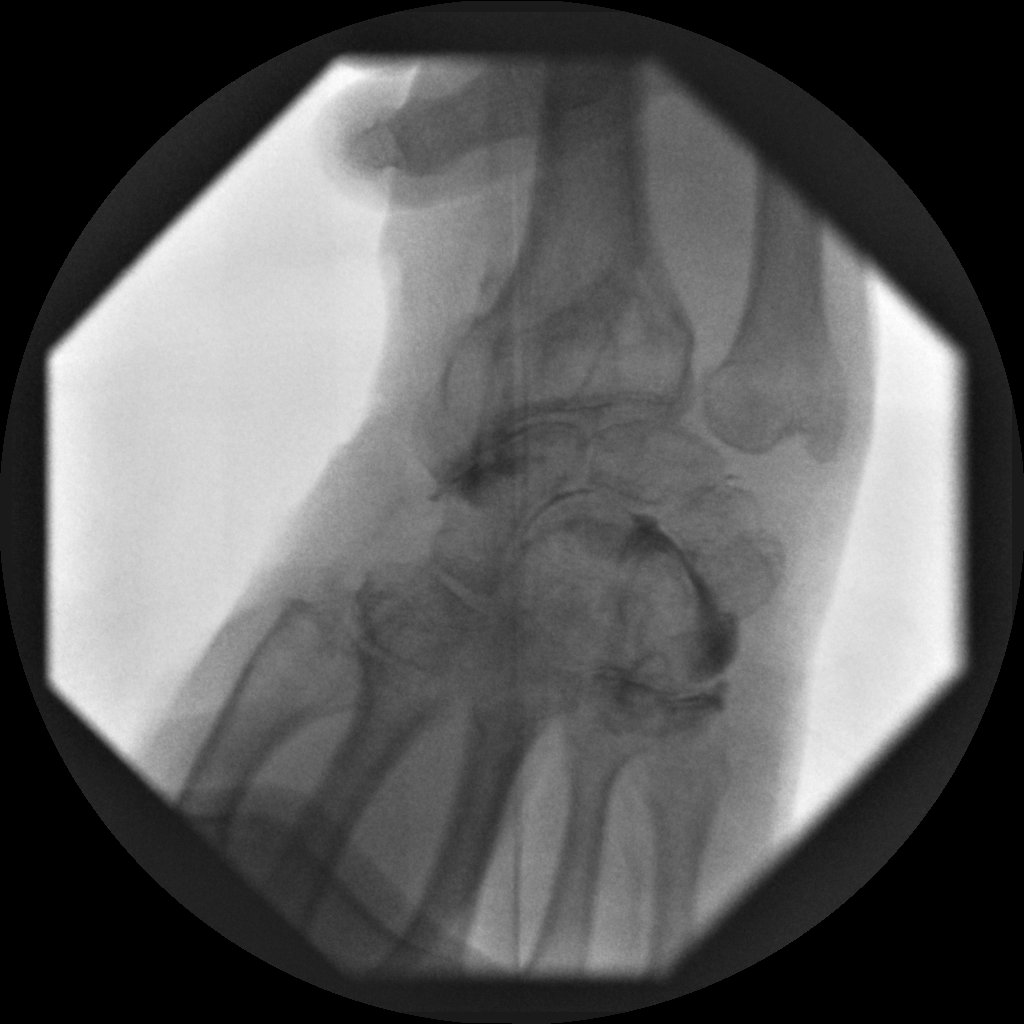

[3 of 3 positions shown; findings below may reference images not displayed]

Left WRIST INJECTION FOR MRI

Procedure:  After a thorough discussion of risks and benefits of
the procedure, written and oral informed consent was obtained.  The
consent discussion included the risk of bleeding, infection and
injury to nerves and adjacent blood vessels.  Extra-articular
injection was also a possible risk discussed.  Verbal  consent was
obtained by Dr. Shiro. Time out form Completed when appropriate. We
discussed the high likelihood of good diagnostic study.

Preliminary localization was performed over the left wrist.  The
area was marked over dorsal scaphoid.

After prep and drape in the usual sterile fashion, the skin and
deeper subcutaneous tissues were anesthetized with 1% Lidocaine
without Epinephrine.  Under fluoroscopic guidance, a 23 gauge
hypodermic needle was advanced into the joint between distal radius
using a dorsal approach.  The joint was distended with 1.5  ml of a
[DATE] dilution of Multihance contrast.  The MR arthrogram solution
was as follows:  7.5 ml of Omnipaque 180 contrast agent, 0.05 mL
Multihance , 2.5 ml of Lidocaine.  After opacification of the
joint, injection was discontinued, the needle removed, and a
sterile dressing applied. The patient was taken to MRI for
subsequent imaging.

The patient tolerated the procedure well and there were no
complications.

Fluoroscopy Time: 1 minute 10 seconds

There was immediate extravasation into distal carpal row.  The
pattern of contrast spread is atypical, with preferential spread of
contrast into the distal carpal row rather than into the distal
radial ulnar joint as demonstrated on the recent prior arthrogram.
IMPRESSION: Successful left wrist fluoroscopically guided injection.
Scapholunate ligament tear.  TFCC tear was demonstrated on prior
injection with filling of the distal radial ulnar joint.  This is
not observed on today's study.

## 2015-11-09 IMAGING — CR DG SHOULDER 2+V*R*
3 series · 3 of 3 positions shown · non-contrast
Comparison: None.

CLINICAL DATA: Right shoulder pain since a fall on 03/13/2014.

EXAM:
RIGHT SHOULDER - 2+ VIEW

[view not recorded (1 of 3)]
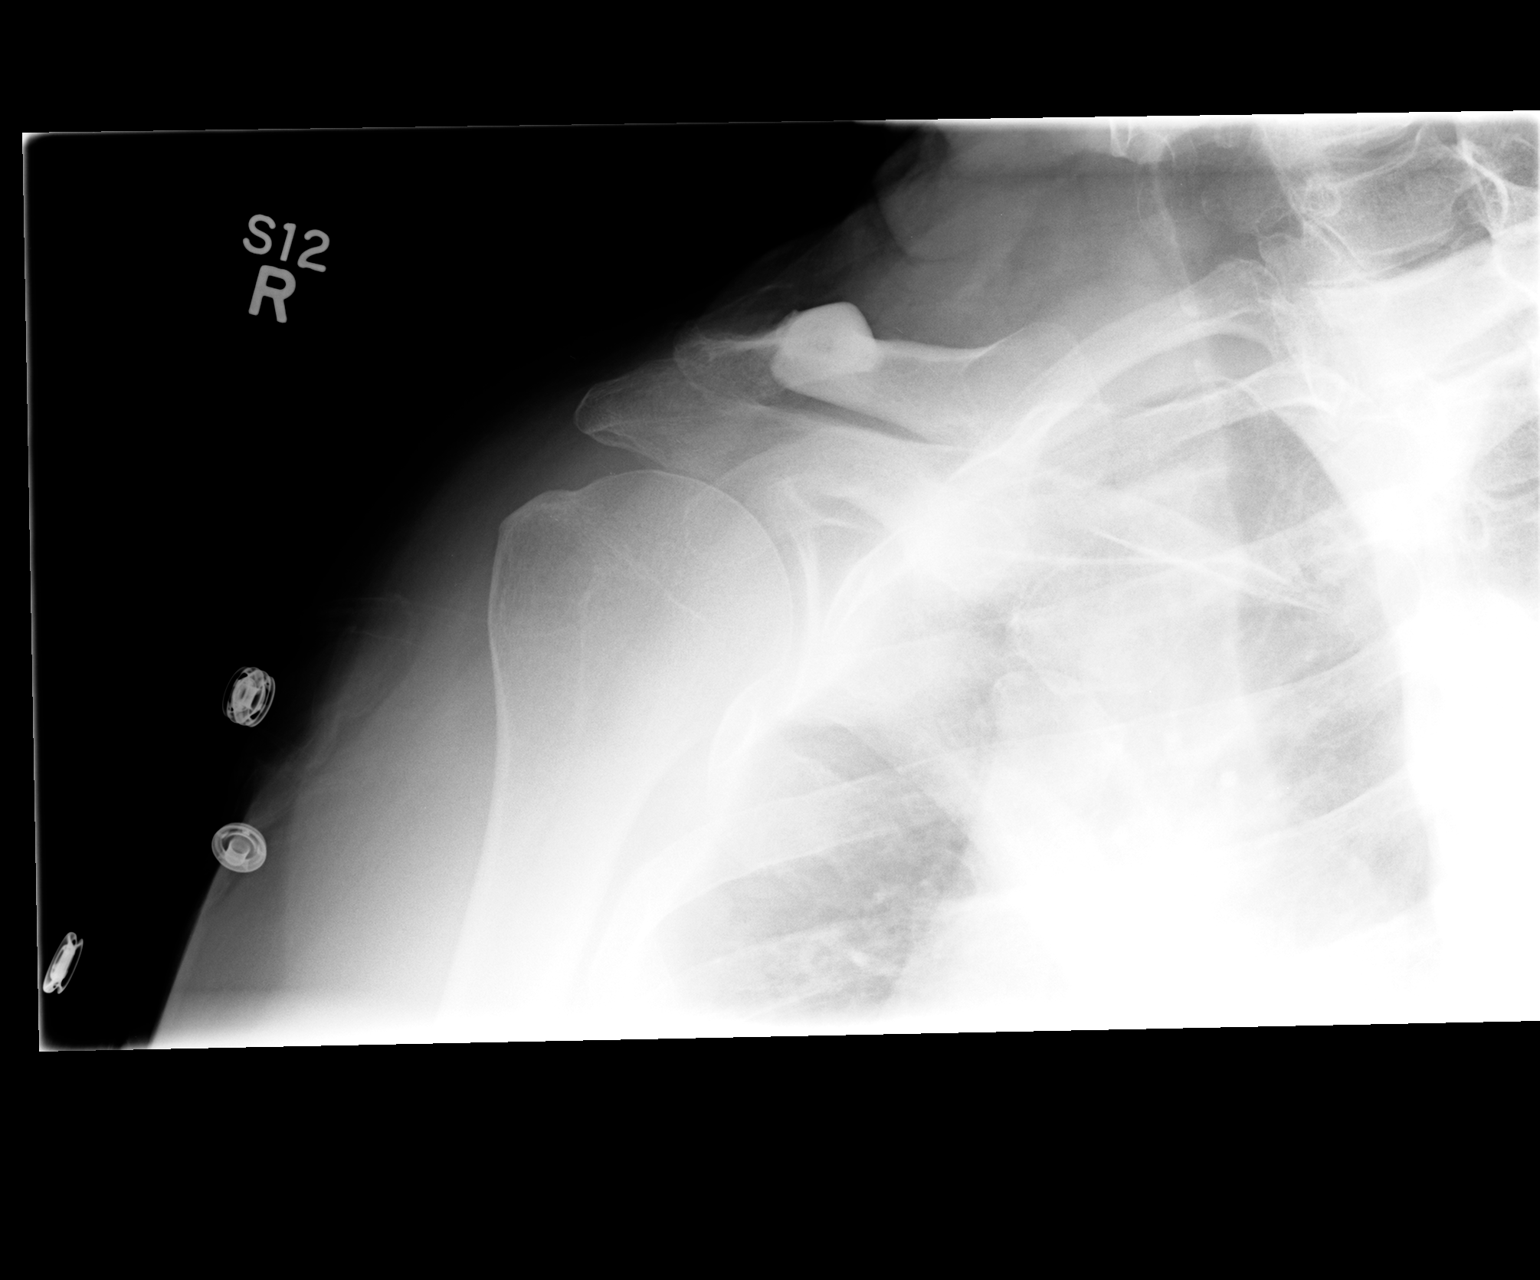

[view not recorded (2 of 3)]
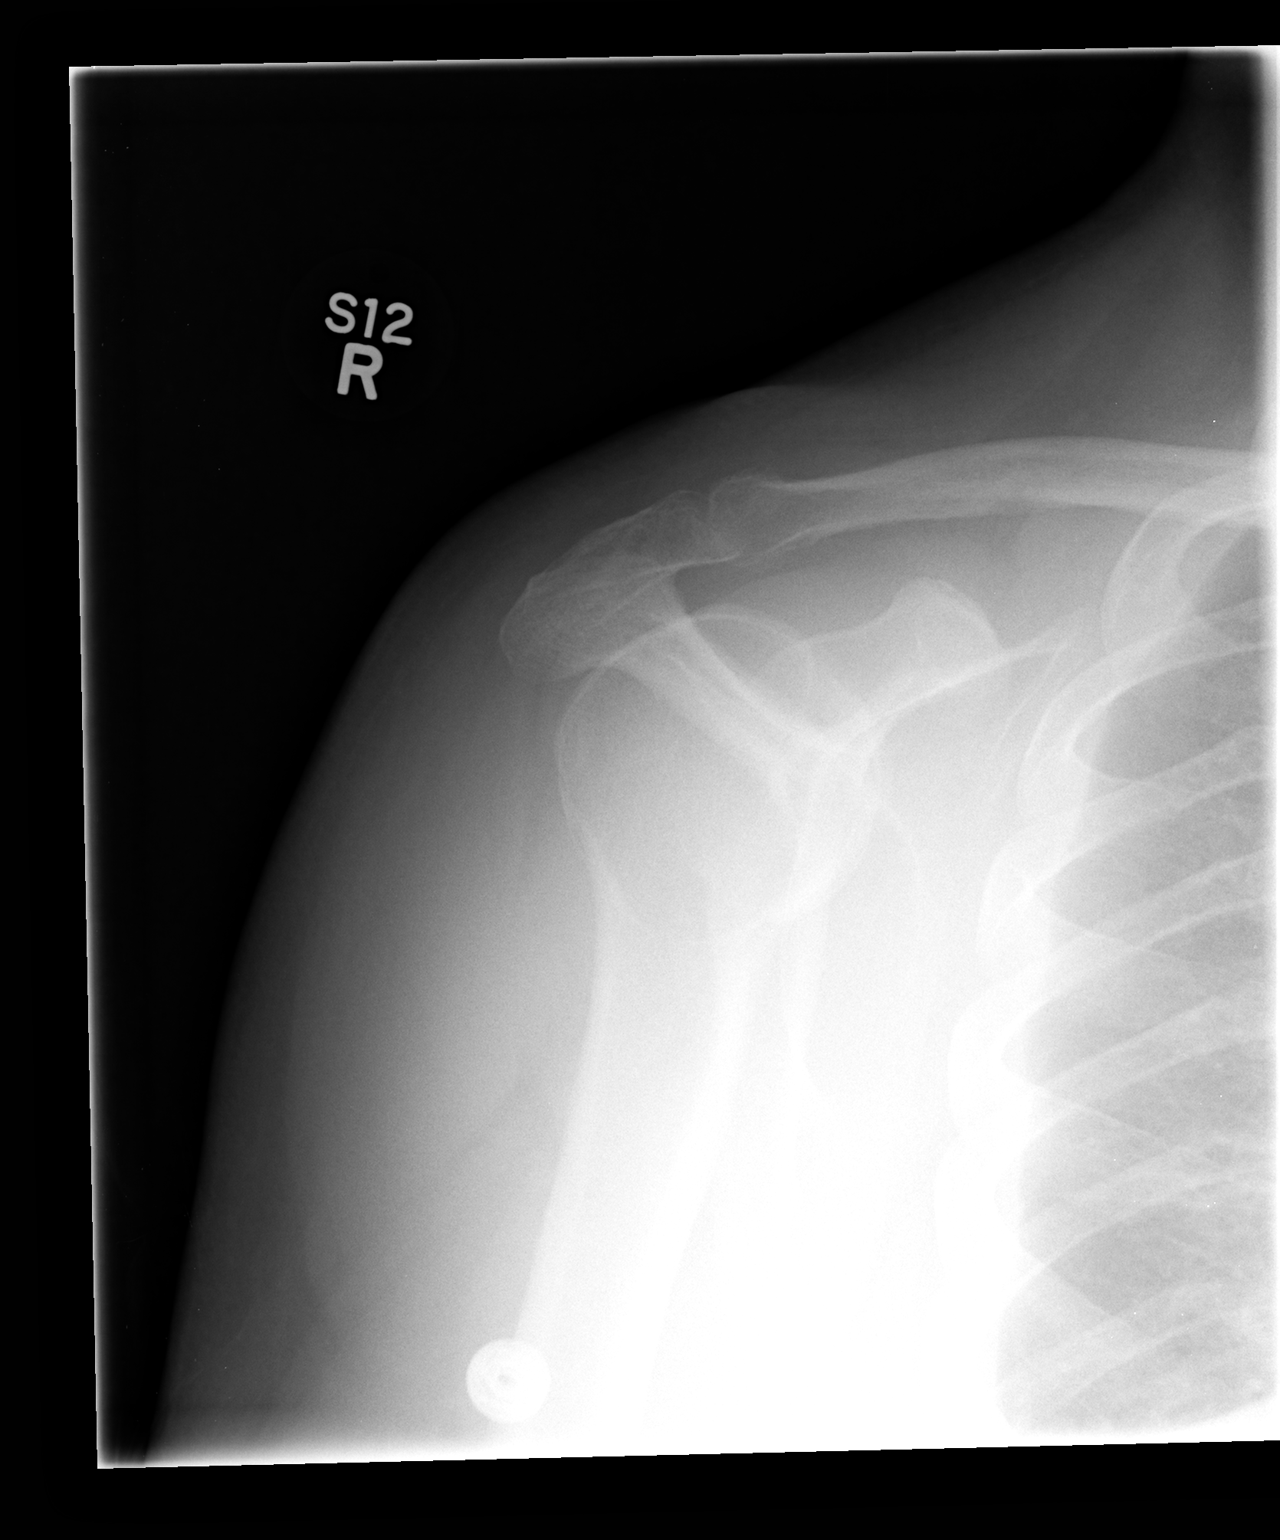

[view not recorded (3 of 3)]
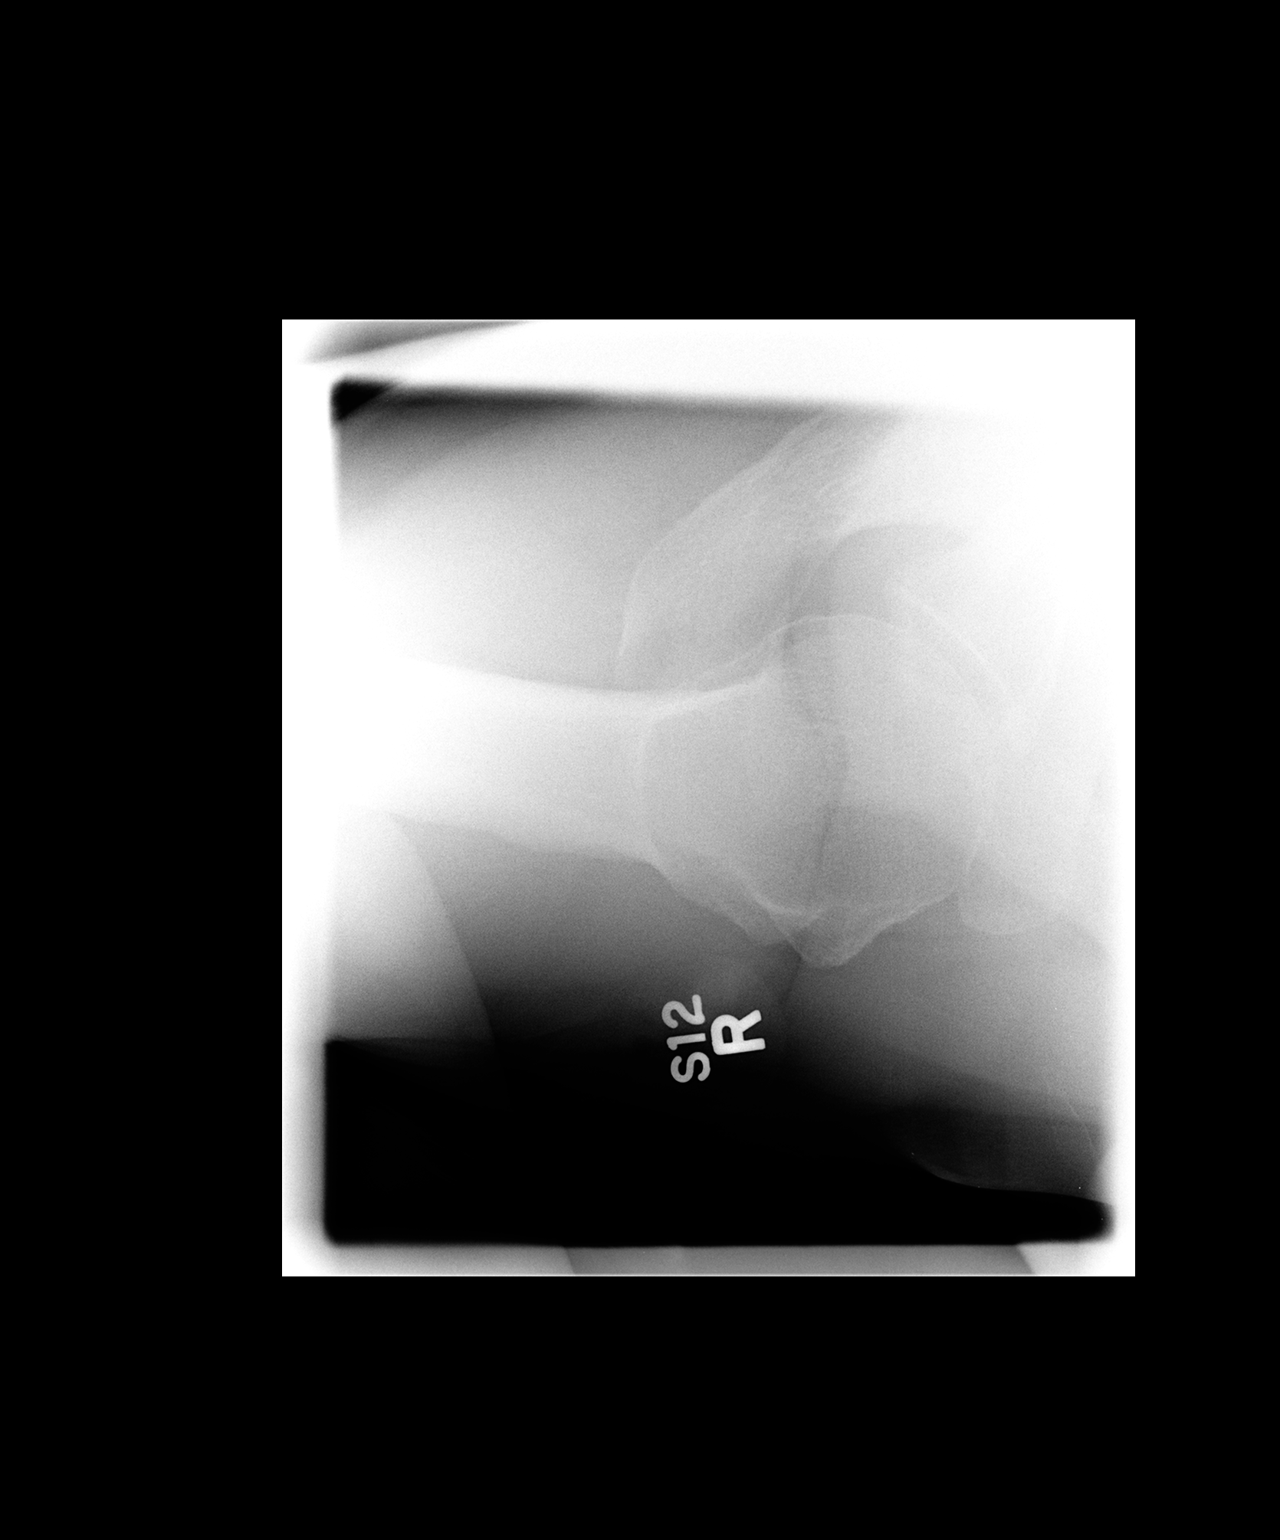

[3 of 3 positions shown; findings below may reference images not displayed]

FINDINGS: There is no evidence of fracture or dislocation. There is no
evidence of arthropathy or other focal bone abnormality. Soft
tissues are unremarkable.
IMPRESSION: Normal exam.

## 2017-03-27 ENCOUNTER — Telehealth: Payer: Self-pay | Admitting: *Deleted

## 2017-03-27 ENCOUNTER — Encounter: Payer: Self-pay | Admitting: Gastroenterology

## 2017-03-27 ENCOUNTER — Other Ambulatory Visit: Payer: Self-pay | Admitting: *Deleted

## 2017-03-27 ENCOUNTER — Encounter: Payer: Self-pay | Admitting: *Deleted

## 2017-03-27 ENCOUNTER — Ambulatory Visit (INDEPENDENT_AMBULATORY_CARE_PROVIDER_SITE_OTHER): Payer: BLUE CROSS/BLUE SHIELD | Admitting: Gastroenterology

## 2017-03-27 DIAGNOSIS — K6289 Other specified diseases of anus and rectum: Secondary | ICD-10-CM | POA: Diagnosis not present

## 2017-03-27 DIAGNOSIS — K625 Hemorrhage of anus and rectum: Secondary | ICD-10-CM | POA: Insufficient documentation

## 2017-03-27 DIAGNOSIS — K5903 Drug induced constipation: Secondary | ICD-10-CM | POA: Diagnosis not present

## 2017-03-27 DIAGNOSIS — T402X5A Adverse effect of other opioids, initial encounter: Secondary | ICD-10-CM | POA: Diagnosis not present

## 2017-03-27 MED ORDER — CLENPIQ 10-3.5-12 MG-GM -GM/160ML PO SOLN: 1.0000 | Freq: Once | ORAL | 0 refills | Status: AC

## 2017-03-27 MED ORDER — NALOXEGOL OXALATE 25 MG PO TABS
25.0000 mg | ORAL_TABLET | Freq: Every day | ORAL | 3 refills | Status: DC
Start: 2017-03-27 — End: 2017-04-12

## 2017-03-27 NOTE — Patient Instructions (Signed)
1. Colonoscopy as scheduled. See separate instructions.  2. Take Movantik 25mg  once daily to offset side effect of constipation from your pain medication. DO NOT take Movantik if you are not taking pain medication (ie if you ever come off at some point).  3. Samples and rebate card of Movantik provided today. RX sent to your pharmacy.  4. Once you have regular bowel movements on Movantik, you can stop the Miralax powder.

## 2017-03-27 NOTE — Progress Notes (Addendum)
Please see if we can get copy of labs from PCP ASAP, need last Hgb and last creatinine. Place on my desk once arrives.   Doris, please make sure patient is aware to take Movantik 1 hour before or 2 hours after meals.

## 2017-03-27 NOTE — Progress Notes (Signed)
I told pt to take Movantik one hour prior to breakfast.

## 2017-03-27 NOTE — Telephone Encounter (Signed)
Spoke with pt and is aware preop appt is schedule for 04/24/17 at 8:00am. Letter mailed.

## 2017-03-27 NOTE — Progress Notes (Signed)
Requested labs ASAP from PCP

## 2017-03-27 NOTE — Assessment & Plan Note (Signed)
61 year old gentleman presenting with several week history of worsening rectal bleeding, rectal discomfort in the setting of opioid-induced constipation.  Patient has a history of chronic rectal pain evaluated in 2016 as outlined above.  States he is chronically constipated since he has been on pain medication.  Has a BM about 1-2 times per week usually strains and is hard.  MiraLAX is help with consistency but still infrequent and unproductive stools.  Rectal bleeding may be secondary to anorectal fissure, hemorrhoids, cannot exclude diverticular bleeding more recently.  Malignancy has not been excluded as well.  Recommend colonoscopy with deep sedation in the OR with Dr. fields in the near future.  I have discussed the risks, alternatives, benefits with regards to but not limited to the risk of reaction to medication, bleeding, infection, perforation and the patient is agreeable to proceed. Written consent to be obtained.  We will start Movantik 25 mg daily.  Samples, rebate card, prescription provided.  If his bowel function has not improved prior to bowel prep for colonoscopy, he will let me know.

## 2017-03-27 NOTE — Progress Notes (Signed)
CC'D TO PCP °

## 2017-03-27 NOTE — Progress Notes (Addendum)
Primary Care Physician:  Zhou-Talbert, Elwyn Lade, MD  Primary Gastroenterologist:  Barney Drain, MD REVIEWED-NO ADDITIONAL RECOMMENDATIONS.  Chief Complaint  Patient presents with  . Rectal Bleeding    HPI:  Brian Russo is a 61 y.o. male here the request of Dr. Angelia Mould for further evaluation of hematochezia and rectal pain.  Colonoscopy requested.  Patient complains of several week history of increased rectal bleeding from baseline "hemorrhoid bleeding".  Really no increased rectal pain although he has chronic intermittent rectal pain.  Complains of hard stools, incomplete bowel movements.  Typically has 1-2 BMs per week in the setting of chronic opioid use.  Previously had tried Colace with no results.  PCP recently started him on MiraLAX.  Stools are softer but not more frequent.  Somewhat less bleeding with softer stools.  No melena.  No upper GI symptoms.  No abdominal pain.  Patient was evaluated at [facility] as well as urology in 2016 for chronic rectal pain. According to urology note  "Cystoscopy unremarkable.  Underwent anorectal manometry which showed elevated pelvic floor resting activity and increased pelvic floor activity with straining.  Findings consistent with pelvic floor dyssynergy and pelvic floor retraining with biofeedback was recommended." Patient unable to complete biofeedback therapy.  Patient was treated with amitriptyline at the time.  States his rectal pain improved somewhat after that point but recurred over the past year.  Last colonoscopy about 10 years ago.  Had an EGD at the same time.  States results were unremarkable.  Recent labs from PCP "were good and I am not anemic".   Current Outpatient Medications  Medication Sig Dispense Refill  . allopurinol (ZYLOPRIM) 100 MG tablet Take 2 tablets by mouth daily.    Marland Kitchen atorvastatin (LIPITOR) 40 MG tablet Take 1 tablet by mouth daily.    Marland Kitchen HYDROcodone-acetaminophen (NORCO) 10-325 MG tablet Take 1 tablet by mouth  5 (five) times daily.    Marland Kitchen ibuprofen (ADVIL,MOTRIN) 800 MG tablet Take 800 mg by mouth every 6 (six) hours.    . tamsulosin (FLOMAX) 0.4 MG CAPS capsule Take 1 capsule by mouth daily.     No current facility-administered medications for this visit.     Allergies as of 03/27/2017  . (No Known Allergies)    Past Medical History:  Diagnosis Date  . Back pain   . Gout   . Hyperlipemia     Past Surgical History:  Procedure Laterality Date  . COLONOSCOPY    . FOOT ARTHROPLASTY  2004   repair fx rt foot  . ULNA OSTEOTOMY Left 02/17/2013   Procedure: ULNAR SHORTENING;  Surgeon: Wynonia Sours, MD;  Location: [facility];  Service: Orthopedics;  Laterality: Left;  . WRIST ARTHRODESIS  1/14   left-[place]  . WRIST ARTHROSCOPY Left 02/17/2013   Procedure: LEFT WRIST ARTHROSCOPY WITH DEBRIDEMENT AND ULNA SHORTENING OSTEOTOMY LEFT;  Surgeon: Wynonia Sours, MD;  Location: [facility];  Service: Orthopedics;  Laterality: Left;    Family History  Problem Relation Age of Onset  . Cancer Mother        ?lung  . Alcoholism Mother   . Alcoholism Father   . Colon cancer Neg Hx     Social History   Socioeconomic History  . Marital status: Married    Spouse name: Not on file  . Number of children: Not on file  . Years of education: Not on file  . Highest education level: Not on file  Social Needs  .  Financial resource strain: Not on file  . Food insecurity - worry: Not on file  . Food insecurity - inability: Not on file  . Transportation needs - medical: Not on file  . Transportation needs - non-medical: Not on file  Occupational History  . Not on file  Tobacco Use  . Smoking status: Former Smoker    Last attempt to quit: 02/12/1981    Years since quitting: 36.1  . Smokeless tobacco: Never Used  Substance and Sexual Activity  . Alcohol use: Yes    Comment: occ  . Drug use: No  . Sexual activity: Not on file  Other Topics Concern  . Not on file   Social History Narrative  . Not on file      ROS:  General: Negative for anorexia, weight loss, fever, chills, fatigue, weakness. Eyes: Negative for vision changes.  ENT: Negative for hoarseness, difficulty swallowing , nasal congestion. CV: Negative for chest pain, angina, palpitations, dyspnea on exertion, peripheral edema.  Respiratory: Negative for dyspnea at rest, dyspnea on exertion, cough, sputum, wheezing.  GI: See history of present illness. GU:  Negative for dysuria, hematuria, urinary incontinence, urinary frequency, nocturnal urination.  MS: Negative for joint pain, chronic low back pain.  Derm: Negative for rash or itching.  Neuro: Negative for weakness, abnormal sensation, seizure, frequent headaches, memory loss, confusion.  Psych: Negative for anxiety, depression, suicidal ideation, hallucinations.  Endo: Negative for unusual weight change.  Heme: Negative for bruising or bleeding. Allergy: Negative for rash or hives.    Physical Examination:  BP 135/84   Pulse (!) 56   Temp (!) 97.1 F (36.2 C) (Oral)   Ht 5\' 7"  (1.702 m)   Wt 231 lb 9.6 oz (105.1 kg)   BMI 36.27 kg/m    General: Well-nourished, well-developed in no acute distress.  Head: Normocephalic, atraumatic.   Eyes: Conjunctiva pink, no icterus. Mouth: Oropharyngeal mucosa moist and pink , no lesions erythema or exudate. Neck: Supple without thyromegaly, masses, or lymphadenopathy.  Lungs: Clear to auscultation bilaterally.  Heart: Regular rate and rhythm, no murmurs rubs or gallops.  Abdomen: Bowel sounds are normal, nontender, nondistended, no hepatosplenomegaly or masses, no abdominal bruits or    hernia , no rebound or guarding.   Rectal: Deferred Extremities: No lower extremity edema. No clubbing or deformities.  Neuro: Alert and oriented x 4 , grossly normal neurologically.  Skin: Warm and dry, no rash or jaundice.   Psych: Alert and cooperative, normal mood and affect.  Imaging  Studies: No results found.

## 2017-04-02 NOTE — Progress Notes (Addendum)
Labs received ordered 03/20/2017 White blood cell count 8500, hemoglobin 13.3, platelets 276,000  No creatinine available. Should get one during pre-op, will follow up as available.

## 2017-04-03 ENCOUNTER — Telehealth: Payer: Self-pay

## 2017-04-03 MED ORDER — LUBIPROSTONE 24 MCG PO CAPS: 24.0000 ug | ORAL_CAPSULE | Freq: Two times a day (BID) | ORAL | 3 refills | Status: DC

## 2017-04-03 NOTE — Telephone Encounter (Signed)
T/C from [facility] at [facility] in reference to the [place]. He would like to know if pt has every tried the Amitiza 24 mg bid. If she has not, she needs to try it first. Call back is [phone]. However, if pt can try the Amitiza first, just send to local pharmacy, [facility].  Magda Paganini, Please advise!

## 2017-04-03 NOTE — Telephone Encounter (Signed)
He can try Amitiza. Please let patient know insurance requires trying amitiza first. I think we sent him out with movantik samples. Would start amitiza when received from pharmacy. Stop movantik when amitiza started.

## 2017-04-03 NOTE — Telephone Encounter (Signed)
PT is aware and also aware he will need to take the Amitiza with food bid.

## 2017-04-10 ENCOUNTER — Telehealth: Payer: Self-pay | Admitting: Gastroenterology

## 2017-04-10 NOTE — Telephone Encounter (Signed)
Pt has a question about his prescription. Please call [phone]

## 2017-04-10 NOTE — Telephone Encounter (Signed)
Pt is aware the pharmacist received an order for the Movantik and the Amitiza. He is aware that Almyra Free has faxed the PA for the Amitiza.

## 2017-04-18 NOTE — Patient Instructions (Signed)
Brian Russo  04/18/2017     @PREFPERIOPPHARMACY @   Your procedure is scheduled on  04/30/2017  Report to [facility] at  1000  A.M.  Call this number if you have problems the morning of surgery:  [phone]   Remember:  Do not eat food or drink liquids after midnight.  Take these medicines the morning of surgery with A SIP OF WATER  Allopurinol, norco, flomax.   Do not wear jewelry, make-up or nail polish.  Do not wear lotions, powders, or perfumes, or deodorant.  Do not shave 48 hours prior to surgery.  Men may shave face and neck.  Do not bring valuables to the hospital.  [facility] is not responsible for any belongings or valuables.  Contacts, dentures or bridgework may not be worn into surgery.  Leave your suitcase in the car.  After surgery it may be brought to your room.  For patients admitted to the hospital, discharge time will be determined by your treatment team.  Patients discharged the day of surgery will not be allowed to drive home.   Name and phone number of your driver:   family Special instructions:  Follow the diet and prep instructions given to you by Dr Nona Dell office.  Please read over the following fact sheets that you were given. Anesthesia Post-op Instructions and Care and Recovery After Surgery       Colonoscopy, Adult A colonoscopy is an exam to look at the entire large intestine. During the exam, a lubricated, bendable tube is inserted into the anus and then passed into the rectum, colon, and other parts of the large intestine. A colonoscopy is often done as a part of normal colorectal screening or in response to certain symptoms, such as anemia, persistent diarrhea, abdominal pain, and blood in the stool. The exam can help screen for and diagnose medical problems, including:  Tumors.  Polyps.  Inflammation.  Areas of bleeding.  Tell a health care provider about:  Any allergies you have.  All medicines you are  taking, including vitamins, herbs, eye drops, creams, and over-the-counter medicines.  Any problems you or family members have had with anesthetic medicines.  Any blood disorders you have.  Any surgeries you have had.  Any medical conditions you have.  Any problems you have had passing stool. What are the risks? Generally, this is a safe procedure. However, problems may occur, including:  Bleeding.  A tear in the intestine.  A reaction to medicines given during the exam.  Infection (rare).  What happens before the procedure? Eating and drinking restrictions Follow instructions from your health care provider about eating and drinking, which may include:  A few days before the procedure - follow a low-fiber diet. Avoid nuts, seeds, dried fruit, raw fruits, and vegetables.  1-3 days before the procedure - follow a clear liquid diet. Drink only clear liquids, such as clear broth or bouillon, black coffee or tea, clear juice, clear soft drinks or sports drinks, gelatin dessert, and popsicles. Avoid any liquids that contain red or purple dye.  On the day of the procedure - do not eat or drink anything during the 2 hours before the procedure, or within the time period that your health care provider recommends.  Bowel prep If you were prescribed an oral bowel prep to clean out your colon:  Take it as told by your health care provider. Starting the day before your procedure, you will  need to drink a large amount of medicated liquid. The liquid will cause you to have multiple loose stools until your stool is almost clear or light green.  If your skin or anus gets irritated from diarrhea, you may use these to relieve the irritation: ? Medicated wipes, such as adult wet wipes with aloe and vitamin E. ? A skin soothing-product like petroleum jelly.  If you vomit while drinking the bowel prep, take a break for up to 60 minutes and then begin the bowel prep again. If vomiting continues and  you cannot take the bowel prep without vomiting, call your health care provider.  General instructions  Ask your health care provider about changing or stopping your regular medicines. This is especially important if you are taking diabetes medicines or blood thinners.  Plan to have someone take you home from the hospital or clinic. What happens during the procedure?  An IV tube may be inserted into one of your veins.  You will be given medicine to help you relax (sedative).  To reduce your risk of infection: ? Your health care team will wash or sanitize their hands. ? Your anal area will be washed with soap.  You will be asked to lie on your side with your knees bent.  Your health care provider will lubricate a long, thin, flexible tube. The tube will have a camera and a light on the end.  The tube will be inserted into your anus.  The tube will be gently eased through your rectum and colon.  Air will be delivered into your colon to keep it open. You may feel some pressure or cramping.  The camera will be used to take images during the procedure.  A small tissue sample may be removed from your body to be examined under a microscope (biopsy). If any potential problems are found, the tissue will be sent to a lab for testing.  If small polyps are found, your health care provider may remove them and have them checked for cancer cells.  The tube that was inserted into your anus will be slowly removed. The procedure may vary among health care providers and hospitals. What happens after the procedure?  Your blood pressure, heart rate, breathing rate, and blood oxygen level will be monitored until the medicines you were given have worn off.  Do not drive for 24 hours after the exam.  You may have a small amount of blood in your stool.  You may pass gas and have mild abdominal cramping or bloating due to the air that was used to inflate your colon during the exam.  It is up to  you to get the results of your procedure. Ask your health care provider, or the department performing the procedure, when your results will be ready. This information is not intended to replace advice given to you by your health care provider. Make sure you discuss any questions you have with your health care provider. Document Released: 04/06/2000 Document Revised: 02/08/2016 Document Reviewed: 06/21/2015 Elsevier Interactive Patient Education  2018 [employer].  Colonoscopy, Adult, Care After This sheet gives you information about how to care for yourself after your procedure. Your health care provider may also give you more specific instructions. If you have problems or questions, contact your health care provider. What can I expect after the procedure? After the procedure, it is common to have:  A small amount of blood in your stool for 24 hours after the procedure.  Some gas.  Mild abdominal cramping or bloating.  Follow these instructions at home: General instructions   For the first 24 hours after the procedure: ? Do not drive or use machinery. ? Do not sign important documents. ? Do not drink alcohol. ? Do your regular daily activities at a slower pace than normal. ? Eat soft, easy-to-digest foods. ? Rest often.  Take over-the-counter or prescription medicines only as told by your health care provider.  It is up to you to get the results of your procedure. Ask your health care provider, or the department performing the procedure, when your results will be ready. Relieving cramping and bloating  Try walking around when you have cramps or feel bloated.  Apply heat to your abdomen as told by your health care provider. Use a heat source that your health care provider recommends, such as a moist heat pack or a heating pad. ? Place a towel between your skin and the heat source. ? Leave the heat on for 20-30 minutes. ? Remove the heat if your skin turns bright red. This is  especially important if you are unable to feel pain, heat, or cold. You may have a greater risk of getting burned. Eating and drinking  Drink enough fluid to keep your urine clear or pale yellow.  Resume your normal diet as instructed by your health care provider. Avoid heavy or fried foods that are hard to digest.  Avoid drinking alcohol for as long as instructed by your health care provider. Contact a health care provider if:  You have blood in your stool 2-3 days after the procedure. Get help right away if:  You have more than a small spotting of blood in your stool.  You pass large blood clots in your stool.  Your abdomen is swollen.  You have nausea or vomiting.  You have a fever.  You have increasing abdominal pain that is not relieved with medicine. This information is not intended to replace advice given to you by your health care provider. Make sure you discuss any questions you have with your health care provider. Document Released: 11/22/2003 Document Revised: 01/02/2016 Document Reviewed: 06/21/2015 Elsevier Interactive Patient Education  2018 [place] Anesthesia is a term that refers to techniques, procedures, and medicines that help a person stay safe and comfortable during a medical procedure. Monitored anesthesia care, or sedation, is one type of anesthesia. Your anesthesia specialist may recommend sedation if you will be having a procedure that does not require you to be unconscious, such as:  Cataract surgery.  A dental procedure.  A biopsy.  A colonoscopy.  During the procedure, you may receive a medicine to help you relax (sedative). There are three levels of sedation:  Mild sedation. At this level, you may feel awake and relaxed. You will be able to follow directions.  Moderate sedation. At this level, you will be sleepy. You may not remember the procedure.  Deep sedation. At this level, you will be asleep. You will  not remember the procedure.  The more medicine you are given, the deeper your level of sedation will be. Depending on how you respond to the procedure, the anesthesia specialist may change your level of sedation or the type of anesthesia to fit your needs. An anesthesia specialist will monitor you closely during the procedure. Let your health care provider know about:  Any allergies you have.  All medicines you are taking, including vitamins, herbs, eye drops, creams, and over-the-counter medicines.  Any use of steroids (by mouth or as a cream).  Any problems you or family members have had with sedatives and anesthetic medicines.  Any blood disorders you have.  Any surgeries you have had.  Any medical conditions you have, such as sleep apnea.  Whether you are pregnant or may be pregnant.  Any use of cigarettes, alcohol, or street drugs. What are the risks? Generally, this is a safe procedure. However, problems may occur, including:  Getting too much medicine (oversedation).  Nausea.  Allergic reaction to medicines.  Trouble breathing. If this happens, a breathing tube may be used to help with breathing. It will be removed when you are awake and breathing on your own.  Heart trouble.  Lung trouble.  Before the procedure Staying hydrated Follow instructions from your health care provider about hydration, which may include:  Up to 2 hours before the procedure - you may continue to drink clear liquids, such as water, clear fruit juice, black coffee, and plain tea.  Eating and drinking restrictions Follow instructions from your health care provider about eating and drinking, which may include:  8 hours before the procedure - stop eating heavy meals or foods such as meat, fried foods, or fatty foods.  6 hours before the procedure - stop eating light meals or foods, such as toast or cereal.  6 hours before the procedure - stop drinking milk or drinks that contain milk.  2  hours before the procedure - stop drinking clear liquids.  Medicines Ask your health care provider about:  Changing or stopping your regular medicines. This is especially important if you are taking diabetes medicines or blood thinners.  Taking medicines such as aspirin and ibuprofen. These medicines can thin your blood. Do not take these medicines before your procedure if your health care provider instructs you not to.  Tests and exams  You will have a physical exam.  You may have blood tests done to show: ? How well your kidneys and liver are working. ? How well your blood can clot.  General instructions  Plan to have someone take you home from the hospital or clinic.  If you will be going home right after the procedure, plan to have someone with you for 24 hours.  What happens during the procedure?  Your blood pressure, heart rate, breathing, level of pain and overall condition will be monitored.  An IV tube will be inserted into one of your veins.  Your anesthesia specialist will give you medicines as needed to keep you comfortable during the procedure. This may mean changing the level of sedation.  The procedure will be performed. After the procedure  Your blood pressure, heart rate, breathing rate, and blood oxygen level will be monitored until the medicines you were given have worn off.  Do not drive for 24 hours if you received a sedative.  You may: ? Feel sleepy, clumsy, or nauseous. ? Feel forgetful about what happened after the procedure. ? Have a sore throat if you had a breathing tube during the procedure. ? Vomit. This information is not intended to replace advice given to you by your health care provider. Make sure you discuss any questions you have with your health care provider. Document Released: 01/03/2005 Document Revised: 09/16/2015 Document Reviewed: 07/31/2015 Elsevier Interactive Patient Education  2018 [place],  Care After These instructions provide you with information about caring for yourself after your procedure. Your health care provider may also give you more  specific instructions. Your treatment has been planned according to current medical practices, but problems sometimes occur. Call your health care provider if you have any problems or questions after your procedure. What can I expect after the procedure? After your procedure, it is common to:  Feel sleepy for several hours.  Feel clumsy and have poor balance for several hours.  Feel forgetful about what happened after the procedure.  Have poor judgment for several hours.  Feel nauseous or vomit.  Have a sore throat if you had a breathing tube during the procedure.  Follow these instructions at home: For at least 24 hours after the procedure:   Do not: ? Participate in activities in which you could fall or become injured. ? Drive. ? Use heavy machinery. ? Drink alcohol. ? Take sleeping pills or medicines that cause drowsiness. ? Make important decisions or sign legal documents. ? Take care of children on your own.  Rest. Eating and drinking  Follow the diet that is recommended by your health care provider.  If you vomit, drink water, juice, or soup when you can drink without vomiting.  Make sure you have little or no nausea before eating solid foods. General instructions  Have a responsible adult stay with you until you are awake and alert.  Take over-the-counter and prescription medicines only as told by your health care provider.  If you smoke, do not smoke without supervision.  Keep all follow-up visits as told by your health care provider. This is important. Contact a health care provider if:  You keep feeling nauseous or you keep vomiting.  You feel light-headed.  You develop a rash.  You have a fever. Get help right away if:  You have trouble breathing. This information is not intended to replace  advice given to you by your health care provider. Make sure you discuss any questions you have with your health care provider. Document Released: 07/31/2015 Document Revised: 11/30/2015 Document Reviewed: 07/31/2015 Elsevier Interactive Patient Education  [employer].

## 2017-04-24 ENCOUNTER — Encounter (HOSPITAL_COMMUNITY)
Admission: RE | Admit: 2017-04-24 | Discharge: 2017-04-24 | Disposition: A | Discharge status: Home / Self Care | Payer: BLUE CROSS/BLUE SHIELD | Source: Ambulatory Visit | Attending: Gastroenterology | Admitting: Gastroenterology

## 2017-04-24 ENCOUNTER — Encounter (HOSPITAL_COMMUNITY): Payer: Self-pay

## 2017-04-24 ENCOUNTER — Other Ambulatory Visit: Payer: Self-pay

## 2017-04-24 DIAGNOSIS — R001 Bradycardia, unspecified: Secondary | ICD-10-CM | POA: Insufficient documentation

## 2017-04-24 DIAGNOSIS — Z01812 Encounter for preprocedural laboratory examination: Secondary | ICD-10-CM | POA: Diagnosis not present

## 2017-04-24 DIAGNOSIS — Z01818 Encounter for other preprocedural examination: Secondary | ICD-10-CM | POA: Insufficient documentation

## 2017-04-24 HISTORY — DX: Unspecified osteoarthritis, unspecified site: M19.90

## 2017-04-24 HISTORY — DX: Benign prostatic hyperplasia without lower urinary tract symptoms: N40.0

## 2017-04-24 LAB — CBC WITH DIFFERENTIAL/PLATELET
BASOS ABS: 0 10*3/uL (ref 0.0–0.1)
BASOS PCT: 0 %
EOS PCT: 2 %
Eosinophils Absolute: 0.2 10*3/uL (ref 0.0–0.7)
HEMATOCRIT: 41.2 % (ref 39.0–52.0)
Hemoglobin: 13.4 g/dL (ref 13.0–17.0)
Lymphocytes Relative: 21 %
Lymphs Abs: 1.5 10*3/uL (ref 0.7–4.0)
MCH: 31.2 pg (ref 26.0–34.0)
MCHC: 32.5 g/dL (ref 30.0–36.0)
MCV: 96 fL (ref 78.0–100.0)
MONO ABS: 0.6 10*3/uL (ref 0.1–1.0)
MONOS PCT: 8 %
NEUTROS ABS: 4.9 10*3/uL (ref 1.7–7.7)
Neutrophils Relative %: 69 %
PLATELETS: 244 10*3/uL (ref 150–400)
RBC: 4.29 MIL/uL (ref 4.22–5.81)
RDW: 14 % (ref 11.5–15.5)
WBC: 7.2 10*3/uL (ref 4.0–10.5)

## 2017-04-24 LAB — BASIC METABOLIC PANEL
Anion gap: 12 (ref 5–15)
BUN: 15 mg/dL (ref 6–20)
CALCIUM: 9.3 mg/dL (ref 8.9–10.3)
CO2: 20 mmol/L — AB (ref 22–32)
CREATININE: 0.83 mg/dL (ref 0.61–1.24)
Chloride: 106 mmol/L (ref 101–111)
Glucose, Bld: 108 mg/dL — ABNORMAL HIGH (ref 65–99)
Potassium: 4 mmol/L (ref 3.5–5.1)
Sodium: 138 mmol/L (ref 135–145)

## 2017-04-29 ENCOUNTER — Other Ambulatory Visit: Payer: Self-pay

## 2017-04-29 DIAGNOSIS — R9431 Abnormal electrocardiogram [ECG] [EKG]: Secondary | ICD-10-CM

## 2017-04-29 NOTE — Progress Notes (Signed)
Pt is aware. Ok to refer to cardiologist, he has never seen one.

## 2017-04-30 ENCOUNTER — Ambulatory Visit (HOSPITAL_COMMUNITY): Payer: BLUE CROSS/BLUE SHIELD | Admitting: Anesthesiology

## 2017-04-30 ENCOUNTER — Encounter (HOSPITAL_COMMUNITY)
Admission: RE | Disposition: A | Discharge status: Home / Self Care | Payer: Self-pay | Source: Ambulatory Visit | Attending: Gastroenterology

## 2017-04-30 ENCOUNTER — Encounter (HOSPITAL_COMMUNITY): Payer: Self-pay

## 2017-04-30 ENCOUNTER — Ambulatory Visit (HOSPITAL_COMMUNITY)
Admission: RE | Admit: 2017-04-30 | Discharge: 2017-04-30 | Disposition: A | Discharge status: Home / Self Care | Payer: BLUE CROSS/BLUE SHIELD | Source: Ambulatory Visit | Attending: Gastroenterology | Admitting: Gastroenterology

## 2017-04-30 DIAGNOSIS — E785 Hyperlipidemia, unspecified: Secondary | ICD-10-CM | POA: Diagnosis not present

## 2017-04-30 DIAGNOSIS — K644 Residual hemorrhoidal skin tags: Secondary | ICD-10-CM | POA: Diagnosis not present

## 2017-04-30 DIAGNOSIS — D128 Benign neoplasm of rectum: Secondary | ICD-10-CM

## 2017-04-30 DIAGNOSIS — D124 Benign neoplasm of descending colon: Secondary | ICD-10-CM | POA: Diagnosis not present

## 2017-04-30 DIAGNOSIS — G8929 Other chronic pain: Secondary | ICD-10-CM | POA: Diagnosis not present

## 2017-04-30 DIAGNOSIS — M1990 Unspecified osteoarthritis, unspecified site: Secondary | ICD-10-CM | POA: Diagnosis not present

## 2017-04-30 DIAGNOSIS — M109 Gout, unspecified: Secondary | ICD-10-CM | POA: Diagnosis not present

## 2017-04-30 DIAGNOSIS — K6289 Other specified diseases of anus and rectum: Secondary | ICD-10-CM

## 2017-04-30 DIAGNOSIS — K635 Polyp of colon: Secondary | ICD-10-CM | POA: Insufficient documentation

## 2017-04-30 DIAGNOSIS — K648 Other hemorrhoids: Secondary | ICD-10-CM

## 2017-04-30 DIAGNOSIS — D123 Benign neoplasm of transverse colon: Secondary | ICD-10-CM | POA: Diagnosis not present

## 2017-04-30 DIAGNOSIS — Z87891 Personal history of nicotine dependence: Secondary | ICD-10-CM | POA: Insufficient documentation

## 2017-04-30 DIAGNOSIS — K921 Melena: Secondary | ICD-10-CM | POA: Diagnosis not present

## 2017-04-30 DIAGNOSIS — D125 Benign neoplasm of sigmoid colon: Secondary | ICD-10-CM | POA: Diagnosis not present

## 2017-04-30 DIAGNOSIS — N400 Enlarged prostate without lower urinary tract symptoms: Secondary | ICD-10-CM | POA: Insufficient documentation

## 2017-04-30 DIAGNOSIS — Z79899 Other long term (current) drug therapy: Secondary | ICD-10-CM | POA: Insufficient documentation

## 2017-04-30 DIAGNOSIS — K5730 Diverticulosis of large intestine without perforation or abscess without bleeding: Secondary | ICD-10-CM | POA: Insufficient documentation

## 2017-04-30 DIAGNOSIS — K625 Hemorrhage of anus and rectum: Secondary | ICD-10-CM

## 2017-04-30 HISTORY — PX: COLONOSCOPY WITH PROPOFOL: SHX5780

## 2017-04-30 HISTORY — PX: POLYPECTOMY: SHX5525

## 2017-04-30 SURGERY — COLONOSCOPY WITH PROPOFOL
Anesthesia: Monitor Anesthesia Care

## 2017-04-30 MED ORDER — CHLORHEXIDINE GLUCONATE CLOTH 2 % EX PADS: 6.0000 | MEDICATED_PAD | Freq: Once | CUTANEOUS | Status: DC

## 2017-04-30 MED ORDER — MIDAZOLAM HCL 2 MG/2ML IJ SOLN
1.0000 mg | INTRAMUSCULAR | Status: AC
  Administered 2017-04-30: 2 mg via INTRAVENOUS

## 2017-04-30 MED ORDER — PROPOFOL 10 MG/ML IV BOLUS
INTRAVENOUS | Status: DC | PRN
  Administered 2017-04-30: 15 mg via INTRAVENOUS
  Administered 2017-04-30: 30 mg via INTRAVENOUS
  Administered 2017-04-30 (×2): 15 mg via INTRAVENOUS

## 2017-04-30 MED ORDER — PROPOFOL 10 MG/ML IV BOLUS
INTRAVENOUS | Status: AC
  Filled 2017-04-30: qty 20

## 2017-04-30 MED ORDER — PROPOFOL 500 MG/50ML IV EMUL
INTRAVENOUS | Status: DC | PRN
  Administered 2017-04-30: 150 ug/kg/min via INTRAVENOUS
  Administered 2017-04-30: 11:00:00 via INTRAVENOUS

## 2017-04-30 MED ORDER — FENTANYL CITRATE (PF) 100 MCG/2ML IJ SOLN
25.0000 ug | Freq: Once | INTRAMUSCULAR | Status: AC
  Administered 2017-04-30: 25 ug via INTRAVENOUS

## 2017-04-30 MED ORDER — FENTANYL CITRATE (PF) 100 MCG/2ML IJ SOLN
INTRAMUSCULAR | Status: AC
  Filled 2017-04-30: qty 2

## 2017-04-30 MED ORDER — MIDAZOLAM HCL 2 MG/2ML IJ SOLN
INTRAMUSCULAR | Status: AC
  Filled 2017-04-30: qty 2

## 2017-04-30 MED ORDER — LACTATED RINGERS IV SOLN
INTRAVENOUS | Status: DC | PRN
  Administered 2017-04-30: 11:00:00 via INTRAVENOUS

## 2017-04-30 MED ORDER — LACTATED RINGERS IV SOLN
INTRAVENOUS | Status: DC
  Administered 2017-04-30: 10:00:00 via INTRAVENOUS

## 2017-04-30 NOTE — Anesthesia Postprocedure Evaluation (Signed)
Anesthesia Post Note  Patient: Brian Russo  Procedure(s) Performed: COLONOSCOPY WITH PROPOFOL (N/A ) POLYPECTOMY  Patient location during evaluation: PACU Anesthesia Type: MAC Level of consciousness: awake, awake and alert, oriented and patient cooperative Pain management: pain level controlled Vital Signs Assessment: post-procedure vital signs reviewed and stable Respiratory status: spontaneous breathing, nonlabored ventilation, patient connected to face mask oxygen and respiratory function stable Cardiovascular status: stable Postop Assessment: no apparent nausea or vomiting Anesthetic complications: no     Last Vitals:  Vitals:   04/30/17 0948  BP: 120/83  Pulse: 62  Resp: 14  Temp: 36.8 C  SpO2: 97%    Last Pain:  Vitals:   04/30/17 1058  TempSrc:   PainSc: 4                  Tarea Skillman L

## 2017-04-30 NOTE — Transfer of Care (Signed)
Immediate Anesthesia Transfer of Care Note  Patient: Brian Russo  Procedure(s) Performed: COLONOSCOPY WITH PROPOFOL (N/A ) POLYPECTOMY  Patient Location: PACU  Anesthesia Type:MAC  Level of Consciousness: awake, alert , oriented and patient cooperative  Airway & Oxygen Therapy: Patient Spontanous Breathing and Patient connected to face mask oxygen  Post-op Assessment: Report given to RN and Post -op Vital signs reviewed and stable  Post vital signs: Reviewed and stable  Last Vitals:  Vitals:   04/30/17 0948  BP: 120/83  Pulse: 62  Resp: 14  Temp: 36.8 C  SpO2: 97%    Last Pain:  Vitals:   04/30/17 1058  TempSrc:   PainSc: 4          Complications: No apparent anesthesia complications

## 2017-04-30 NOTE — H&P (Signed)
Primary Care Physician:  Zhou-Talbert, Elwyn Lade, MD Primary Gastroenterologist:  Dr. Oneida Alar  Pre-Procedure History & Physical: HPI:  Brian Russo is a 62 y.o. male here for RECTAL BLEEDING/pain.  Past Medical History:  Diagnosis Date  . Arthritis   . Back pain   . BPH (benign prostatic hyperplasia)   . Gout   . Hyperlipemia     Past Surgical History:  Procedure Laterality Date  . COLONOSCOPY    . FOOT ARTHROPLASTY  2004   repair fx rt foot  . ULNA OSTEOTOMY Left 02/17/2013   Procedure: ULNAR SHORTENING;  Surgeon: Wynonia Sours, MD;  Location: [facility];  Service: Orthopedics;  Laterality: Left;  . WRIST ARTHRODESIS  1/14   left-[place],va  . WRIST ARTHROSCOPY Left 02/17/2013   Procedure: LEFT WRIST ARTHROSCOPY WITH DEBRIDEMENT AND ULNA SHORTENING OSTEOTOMY LEFT;  Surgeon: Wynonia Sours, MD;  Location: [facility];  Service: Orthopedics;  Laterality: Left;    Prior to Admission medications   Medication Sig Start Date End Date Taking? Authorizing Provider  allopurinol (ZYLOPRIM) 100 MG tablet Take 2 tablets by mouth daily. 03/25/17  Yes [provider]  atorvastatin (LIPITOR) 40 MG tablet Take 1 tablet by mouth daily. 03/22/17  Yes [provider]  HYDROcodone-acetaminophen (NORCO) 10-325 MG tablet Take 1 tablet by mouth every 4 (four) hours as needed for moderate pain.    Yes [provider]  tamsulosin (FLOMAX) 0.4 MG CAPS capsule Take 1 capsule by mouth daily. 03/22/17  Yes [provider]    Allergies as of 03/27/2017  . (No Known Allergies)    Family History  Problem Relation Age of Onset  . Cancer Mother        ?lung  . Alcoholism Mother   . Alcoholism Father   . Colon cancer Neg Hx   . Colon polyps Neg Hx     Social History   Socioeconomic History  . Marital status: Married    Spouse name: Not on file  . Number of children: Not on file  . Years of education: Not on file  . Highest education  level: Not on file  Social Needs  . Financial resource strain: Not on file  . Food insecurity - worry: Not on file  . Food insecurity - inability: Not on file  . Transportation needs - medical: Not on file  . Transportation needs - non-medical: Not on file  Occupational History  . Not on file  Tobacco Use  . Smoking status: Former Smoker    Packs/day: 0.25    Years: 2.00    Pack years: 0.50    Types: Cigarettes    Last attempt to quit: 02/12/1981    Years since quitting: 36.2  . Smokeless tobacco: Never Used  Substance and Sexual Activity  . Alcohol use: Yes    Comment: occ  . Drug use: No  . Sexual activity: Not Currently    Birth control/protection: None  Other Topics Concern  . Not on file  Social History Narrative  . Not on file    Review of Systems: See HPI, otherwise negative ROS   Physical Exam: BP 120/83   Pulse 62   Temp 98.2 F (36.8 C) (Oral)   Resp 14   Ht 5\' 7"  (1.702 m)   Wt 240 lb (108.9 kg)   SpO2 97%   BMI 37.59 kg/m  General:   Alert,  pleasant and cooperative in NAD Head:  Normocephalic and atraumatic. Neck:  Supple; Lungs:  Clear throughout to auscultation.    Heart:  Regular rate and rhythm. Abdomen:  Soft, nontender and nondistended. Normal bowel sounds, without guarding, and without rebound.   Neurologic:  Alert and  oriented x4;  grossly normal neurologically.  Impression/Plan:     RECTAL BLEEDING/pain  PLAN:  1. TCS TODAY. DISCUSSED PROCEDURE, BENEFITS, & RISKS: < 1% chance of medication reaction, bleeding, perforation, or rupture of spleen/liver.

## 2017-04-30 NOTE — Anesthesia Preprocedure Evaluation (Signed)
Anesthesia Evaluation  Patient identified by MRN, date of birth, ID band Patient awake    Reviewed: Allergy & Precautions, NPO status , Patient's Chart, lab work & pertinent test results  Airway Mallampati: III  TM Distance: >3 FB Neck ROM: Full    Dental  (+) Teeth Intact   Pulmonary former smoker,    breath sounds clear to auscultation       Cardiovascular negative cardio ROS   Rhythm:Regular Rate:Normal     Neuro/Psych negative psych ROS   GI/Hepatic negative GI ROS, Neg liver ROS,   Endo/Other  negative endocrine ROS  Renal/GU negative Renal ROS Bladder dysfunction: chronic back pain.      Musculoskeletal  (+) Arthritis ,   Abdominal   Peds  Hematology negative hematology ROS (+)   Anesthesia Other Findings   Reproductive/Obstetrics                             Anesthesia Physical Anesthesia Plan  ASA: II  Anesthesia Plan: MAC   Post-op Pain Management:    Induction: Intravenous  PONV Risk Score and Plan:   Airway Management Planned: Simple Face Mask  Additional Equipment:   Intra-op Plan:   Post-operative Plan:   Informed Consent: I have reviewed the patients History and Physical, chart, labs and discussed the procedure including the risks, benefits and alternatives for the proposed anesthesia with the patient or authorized representative who has indicated his/her understanding and acceptance.     Plan Discussed with:   Anesthesia Plan Comments:         Anesthesia Quick Evaluation

## 2017-04-30 NOTE — Op Note (Signed)
[facility] Patient Name: Brian Russo Procedure Date: 04/30/2017 10:38 AM MRN: [id number] Date of Birth: [birth year] Attending MD: Barney Drain MD, MD CSN: [id number] Age: 62 Admit Type: Outpatient Procedure:                Colonoscopy WITH COLD SNARE POLYPECTOMY Indications:              Hematochezia Providers:                Barney Drain MD, MD, Otis Peak B. Sharon Seller, RN,                            Randa Spike, Technician Referring MD:              Medicines:                Propofol per Anesthesia Complications:            No immediate complications. Estimated Blood Loss:     Estimated blood loss was minimal. Procedure:                Pre-Anesthesia Assessment:                           - Prior to the procedure, a History and Physical                            was performed, and patient medications and                            allergies were reviewed. The patient's tolerance of                            previous anesthesia was also reviewed. The risks                            and benefits of the procedure and the sedation                            options and risks were discussed with the patient.                            All questions were answered, and informed consent                            was obtained. Prior Anticoagulants: The patient has                            taken no previous anticoagulant or antiplatelet                            agents. ASA Grade Assessment: II - A patient with                            mild systemic disease. After reviewing the risks  and benefits, the patient was deemed in                            satisfactory condition to undergo the procedure.                            After obtaining informed consent, the colonoscope                            was passed under direct vision. Throughout the                            procedure, the patient's blood pressure, pulse, and   oxygen saturations were monitored continuously. The                            EC-[id number] ([id number]) scope was introduced through                            the anus and advanced to the 5 cm into the ileum.                            The colonoscopy was somewhat difficult due to a                            tortuous colon. Successful completion of the                            procedure was aided by COLOWRAP. The patient                            tolerated the procedure well. The quality of the                            bowel preparation was good. The terminal ileum,                            ileocecal valve, appendiceal orifice, and rectum                            were photographed. Scope In: 11:08:22 AM Scope Out: 11:38:43 AM Scope Withdrawal Time: 0 hours 28 minutes 48 seconds  Total Procedure Duration: 0 hours 30 minutes 21 seconds  Findings:      The terminal ileum appeared normal.      Seven sessile polyps were found in the rectum, sigmoid colon, descending       colon, splenic flexure, transverse colon and hepatic flexure. The polyps       were 3 to 5 mm in size. These polyps were removed with a cold snare.       Resection and retrieval were complete.      Multiple small and large-mouthed diverticula were found in the       recto-sigmoid colon, sigmoid colon and descending colon.      The recto-sigmoid colon, sigmoid colon and descending colon were  moderately redundant.      External and internal hemorrhoids were found during retroflexion. The       hemorrhoids were moderate. Impression:               - The examined portion of the ileum was normal.                           - Seven 3 to 5 mm polyps in the rectum, in the                            sigmoid colon, in the descending colon, at the                            splenic flexure, in the transverse colon and at the                            hepatic flexure, removed with a cold snare.                             Resected and retrieved.                           - MODERATE Diverticulosis in the recto-sigmoid                            colon, in the sigmoid colon and in the descending                            colon.                           - Redundant LEFT colon.                           - RECTAL BLEEDING/PAIN DUE TO External and internal                            hemorrhoids. Moderate Sedation:      Per Anesthesia Care Recommendation:           - Repeat colonoscopy in 3 years for surveillance.                            IF HE FAILS MEDICAL MANAGEMENT HE SHOULD SEE                            SUREGRY TO FIX HEMORRHOIDS.                           - High fiber diet.                           - Continue present medications.                           - Return to my office in 6 months.                           -  Patient has a contact number available for                            emergencies. The signs and symptoms of potential                            delayed complications were discussed with the                            patient. Return to normal activities tomorrow.                            Written discharge instructions were provided to the                            patient. Procedure Code(s):        --- Professional ---                           [phone], Colonoscopy, flexible; with removal of                            tumor(s), polyp(s), or other lesion(s) by snare                            technique Diagnosis Code(s):        --- Professional ---                           K64.8, Other hemorrhoids                           K62.1, Rectal polyp                           D12.5, Benign neoplasm of sigmoid colon                           D12.4, Benign neoplasm of descending colon                           D12.3, Benign neoplasm of transverse colon (hepatic                            flexure or splenic flexure)                           K92.1, Melena (includes Hematochezia)                            K57.30, Diverticulosis of large intestine without                            perforation or abscess without bleeding                           Q43.8, Other specified congenital malformations of  intestine CPT copyright 2016 [employer] Association. All rights reserved. The codes documented in this report are preliminary and upon coder review may  be revised to meet current compliance requirements. Barney Drain, MD Barney Drain MD, MD 04/30/2017 11:50:40 AM This report has been signed electronically. Number of Addenda: 0

## 2017-04-30 NOTE — Discharge Instructions (Signed)
You have moderate internal AND EXTERNAL HEMORRHOIDS and diverticulosis IN YOUR LEFT COLON. YOU HAD SEVEN POLYPS REMOVED.   DRINK WATER TO KEEP YOUR URINE LIGHT YELLOW.  FOLLOW A HIGH FIBER DIET. AVOID ITEMS THAT CAUSE BLOATING. See info below.  CONTINUE YOUR WEIGHT LOSS EFFORTS.  WHILE I DO NOT WANT TO ALARM YOU, YOUR BODY MASS INDEX (BMI) IS OVER 40 WHICH MEANS YOU ARE MORBIDLY OBESE. OBESITY ACTIVATES CANCER GENES. MORBID OBESITY SHORTENS YOUR LIFE EXPECTANCY 10 YEARS. OBESITY IS ASSOCIATED WITH AN INCREASED RISK FOR CIRRHOSIS AND ALL CANCERS, INCLUDING ESOPHAGEAL AND COLON CANCER. A WEIGHT OF   WILL GET YOUR BMI UNDER 30.  YOUR BIOPSY RESULTS WILL BE AVAILABLE IN MY CHART AFTER JAN 14 AND MY OFFICE WILL CONTACT YOU IN 10-14 DAYS WITH YOUR RESULTS.   Next colonoscopy in 3 years.  Colonoscopy Care After Read the instructions outlined below and refer to this sheet in the next week. These discharge instructions provide you with general information on caring for yourself after you leave the hospital. While your treatment has been planned according to the most current medical practices available, unavoidable complications occasionally occur. If you have any problems or questions after discharge, call DR. Keyon Winnick, [phone].  ACTIVITY  You may resume your regular activity, but move at a slower pace for the next 24 hours.   Take frequent rest periods for the next 24 hours.   Walking will help get rid of the air and reduce the bloated feeling in your belly (abdomen).   No driving for 24 hours (because of the medicine (anesthesia) used during the test).   You may shower.   Do not sign any important legal documents or operate any machinery for 24 hours (because of the anesthesia used during the test).    NUTRITION  Drink plenty of fluids.   You may resume your normal diet as instructed by your doctor.   Begin with a light meal and progress to your normal diet. Heavy or fried foods are  harder to digest and may make you feel sick to your stomach (nauseated).   Avoid alcoholic beverages for 24 hours or as instructed.    MEDICATIONS  You may resume your normal medications.   WHAT YOU CAN EXPECT TODAY  Some feelings of bloating in the abdomen.   Passage of more gas than usual.   Spotting of blood in your stool or on the toilet paper  .  IF YOU HAD POLYPS REMOVED DURING THE COLONOSCOPY:  Eat a soft diet IF YOU HAVE NAUSEA, BLOATING, ABDOMINAL PAIN, OR VOMITING.    FINDING OUT THE RESULTS OF YOUR TEST Not all test results are available during your visit. DR. Oneida Alar WILL CALL YOU WITHIN 14 DAYS OF YOUR PROCEDUE WITH YOUR RESULTS. Do not assume everything is normal if you have not heard from DR. Ryzen Deady, CALL HER OFFICE AT [phone].  SEEK IMMEDIATE MEDICAL ATTENTION AND CALL THE OFFICE: [phone] IF:  You have more than a spotting of blood in your stool.   Your belly is swollen (abdominal distention).   You are nauseated or vomiting.   You have a temperature over 101F.   You have abdominal pain or discomfort that is severe or gets worse throughout the day.  High-Fiber Diet A high-fiber diet changes your normal diet to include more whole grains, legumes, fruits, and vegetables. Changes in the diet involve replacing refined carbohydrates with unrefined foods. The calorie level of the diet is essentially unchanged. The Dietary Reference Intake (recommended  amount) for adult males is 38 grams per day. For adult females, it is 25 grams per day. Pregnant and lactating women should consume 28 grams of fiber per day. Fiber is the intact part of a plant that is not broken down during digestion. Functional fiber is fiber that has been isolated from the plant to provide a beneficial effect in the body. PURPOSE  Increase stool bulk.   Ease and regulate bowel movements.   Lower cholesterol.   REDUCE RISK OF COLON CANCER  INDICATIONS THAT YOU NEED MORE  FIBER  Constipation and hemorrhoids.   Uncomplicated diverticulosis (intestine condition) and irritable bowel syndrome.   Weight management.   As a protective measure against hardening of the arteries (atherosclerosis), diabetes, and cancer.   GUIDELINES FOR INCREASING FIBER IN THE DIET  Start adding fiber to the diet slowly. A gradual increase of about 5 more grams (2 slices of whole-wheat bread, 2 servings of most fruits or vegetables, or 1 bowl of high-fiber cereal) per day is best. Too rapid an increase in fiber may result in constipation, flatulence, and bloating.   Drink enough water and fluids to keep your urine clear or pale yellow. Water, juice, or caffeine-free drinks are recommended. Not drinking enough fluid may cause constipation.   Eat a variety of high-fiber foods rather than one type of fiber.   Try to increase your intake of fiber through using high-fiber foods rather than fiber pills or supplements that contain small amounts of fiber.   The goal is to change the types of food eaten. Do not supplement your present diet with high-fiber foods, but replace foods in your present diet.   INCLUDE A VARIETY OF FIBER SOURCES  Replace refined and processed grains with whole grains, canned fruits with fresh fruits, and incorporate other fiber sources. White rice, white breads, and most bakery goods contain little or no fiber.   Brown whole-grain rice, buckwheat oats, and many fruits and vegetables are all good sources of fiber. These include: broccoli, Brussels sprouts, cabbage, cauliflower, beets, sweet potatoes, white potatoes (skin on), carrots, tomatoes, eggplant, squash, berries, fresh fruits, and dried fruits.   Cereals appear to be the richest source of fiber. Cereal fiber is found in whole grains and bran. Bran is the fiber-rich outer coat of cereal grain, which is largely removed in refining. In whole-grain cereals, the bran remains. In breakfast cereals, the largest  amount of fiber is found in those with "bran" in their names. The fiber content is sometimes indicated on the label.   You may need to include additional fruits and vegetables each day.   In baking, for 1 cup white flour, you may use the following substitutions:   1 cup whole-wheat flour minus 2 tablespoons.   1/2 cup white flour plus 1/2 cup whole-wheat flour.   Polyps, Colon  A polyp is extra tissue that grows inside your body. Colon polyps grow in the large intestine. The large intestine, also called the colon, is part of your digestive system. It is a long, hollow tube at the end of your digestive tract where your body makes and stores stool. Most polyps are not dangerous. They are benign. This means they are not cancerous. But over time, some types of polyps can turn into cancer. Polyps that are smaller than a pea are usually not harmful. But larger polyps could someday become or may already be cancerous. To be safe, doctors remove all polyps and test them.   WHO GETS POLYPS? Anyone  can get polyps, but certain people are more likely than others. You may have a greater chance of getting polyps if:  You are over 50.   You have had polyps before.   Someone in your family has had polyps.   Someone in your family has had cancer of the large intestine.   Find out if someone in your family has had polyps. You may also be more likely to get polyps if you:   Eat a lot of fatty foods   Smoke   Drink alcohol   Do not exercise  Eat too much     PREVENTION There is not one sure way to prevent polyps. You might be able to lower your risk of getting them if you:  Eat more fruits and vegetables and less fatty food.   Do not smoke.   Avoid alcohol.   Exercise every day.   Lose weight if you are overweight.   Eating more calcium and folate can also lower your risk of getting polyps. Some foods that are rich in calcium are milk, cheese, and broccoli. Some foods that are rich in  folate are chickpeas, kidney beans, and spinach.    Diverticulosis Diverticulosis is a common condition that develops when small pouches (diverticula) form in the wall of the colon. The risk of diverticulosis increases with age. It happens more often in people who eat a low-fiber diet. Most individuals with diverticulosis have no symptoms. Those individuals with symptoms usually experience belly (abdominal) pain, constipation, or loose stools (diarrhea).  HOME CARE INSTRUCTIONS  Increase the amount of fiber in your diet as directed by your caregiver or dietician. This may reduce symptoms of diverticulosis.   Drink at least 6 to 8 glasses of water each day to prevent constipation.   Try not to strain when you have a bowel movement.   Avoiding nuts and seeds to prevent complications is NOT NECESSARY.   FOODS HAVING HIGH FIBER CONTENT INCLUDE:  Fruits. Apple, peach, pear, tangerine, raisins, prunes.   Vegetables. Brussels sprouts, asparagus, broccoli, cabbage, carrot, cauliflower, romaine lettuce, spinach, summer squash, tomato, winter squash, zucchini.   Starchy Vegetables. Baked beans, kidney beans, lima beans, split peas, lentils, potatoes (with skin).   Grains. Whole wheat bread, brown rice, bran flake cereal, plain oatmeal, white rice, shredded wheat, bran muffins.    SEEK IMMEDIATE MEDICAL CARE IF:  You develop increasing pain or severe bloating.   You have an oral temperature above 101F.   You develop vomiting or bowel movements that are bloody or black.   Hemorrhoids Hemorrhoids are dilated (enlarged) veins around the rectum. Sometimes clots will form in the veins. This makes them swollen and painful. These are called thrombosed hemorrhoids. Causes of hemorrhoids include:  Constipation.   Straining to have a bowel movement.   HEAVY LIFTING  HOME CARE INSTRUCTIONS  Eat a well balanced diet and drink 6 to 8 glasses of water every day to avoid constipation. You may  also use a bulk laxative.   Avoid straining to have bowel movements.   Keep anal area dry and clean.   Do not use a donut shaped pillow or sit on the toilet for long periods. This increases blood pooling and pain.   Move your bowels when your body has the urge; this will require less straining and will decrease pain and pressure.

## 2017-05-02 ENCOUNTER — Encounter: Payer: Self-pay | Admitting: Cardiology

## 2017-05-02 ENCOUNTER — Ambulatory Visit (INDEPENDENT_AMBULATORY_CARE_PROVIDER_SITE_OTHER): Payer: BLUE CROSS/BLUE SHIELD | Admitting: Cardiology

## 2017-05-02 VITALS — BP 108/68 | HR 84 | Ht 67.0 in | Wt 235.0 lb

## 2017-05-02 DIAGNOSIS — R9431 Abnormal electrocardiogram [ECG] [EKG]: Secondary | ICD-10-CM

## 2017-05-02 NOTE — Progress Notes (Signed)
Clinical Summary Brian Russo is a 62 y.o.male seen as referral, referred by Dr Oneida Alar for abnormal EKG.   1. Abnormal EKG - baseline EKG with suggesting anteroseptal Qwaves, poor Rwave progressoin - denies any prior cardiac chest pain - denies any chest pain, no SOB/DOE - stays very active at work, working Architect. No limitation  CAD:HL, remote tobacco   Past Medical History:  Diagnosis Date  . Arthritis   . Back pain   . BPH (benign prostatic hyperplasia)   . Gout   . Hyperlipemia      No Known Allergies   Current Outpatient Medications  Medication Sig Dispense Refill  . allopurinol (ZYLOPRIM) 100 MG tablet Take 2 tablets by mouth daily.    Marland Kitchen atorvastatin (LIPITOR) 40 MG tablet Take 1 tablet by mouth daily.    Marland Kitchen HYDROcodone-acetaminophen (NORCO) 10-325 MG tablet Take 1 tablet by mouth every 4 (four) hours as needed for moderate pain.     . tamsulosin (FLOMAX) 0.4 MG CAPS capsule Take 1 capsule by mouth daily.     No current facility-administered medications for this visit.      Past Surgical History:  Procedure Laterality Date  . COLONOSCOPY    . FOOT ARTHROPLASTY  2004   repair fx rt foot  . ULNA OSTEOTOMY Left 02/17/2013   Procedure: ULNAR SHORTENING;  Surgeon: Wynonia Sours, MD;  Location: [place];  Service: Orthopedics;  Laterality: Left;  . WRIST ARTHRODESIS  1/14   left-[place]  . WRIST ARTHROSCOPY Left 02/17/2013   Procedure: LEFT WRIST ARTHROSCOPY WITH DEBRIDEMENT AND ULNA SHORTENING OSTEOTOMY LEFT;  Surgeon: Wynonia Sours, MD;  Location: [place];  Service: Orthopedics;  Laterality: Left;     No Known Allergies    Family History  Problem Relation Age of Onset  . Cancer Mother        ?lung  . Alcoholism Mother   . Alcoholism Father   . Colon cancer Neg Hx   . Colon polyps Neg Hx      Social History Mr. Rouse reports that he quit smoking about 36 years ago. His smoking use included  cigarettes. He has a 0.50 pack-year smoking history. he has never used smokeless tobacco. Mr. Sarate reports that he drinks alcohol.   Review of Systems CONSTITUTIONAL: No weight loss, fever, chills, weakness or fatigue.  HEENT: Eyes: No visual loss, blurred vision, double vision or yellow sclerae.No hearing loss, sneezing, congestion, runny nose or sore throat.  SKIN: No rash or itching.  CARDIOVASCULAR: per hpi RESPIRATORY: No shortness of breath, cough or sputum.  GASTROINTESTINAL: No anorexia, nausea, vomiting or diarrhea. No abdominal pain or blood.  GENITOURINARY: No burning on urination, no polyuria NEUROLOGICAL: No headache, dizziness, syncope, paralysis, ataxia, numbness or tingling in the extremities. No change in bowel or bladder control.  MUSCULOSKELETAL: No muscle, back pain, joint pain or stiffness.  LYMPHATICS: No enlarged nodes. No history of splenectomy.  PSYCHIATRIC: No history of depression or anxiety.  ENDOCRINOLOGIC: No reports of sweating, cold or heat intolerance. No polyuria or polydipsia.  Marland Kitchen   Physical Examination Vitals:   05/02/17 0954 05/02/17 0957  BP: 106/74 108/68  Pulse: 84   SpO2: 97%    Vitals:   05/02/17 0954  Weight: 235 lb (106.6 kg)  Height: 5\' 7"  (1.702 m)    Gen: resting comfortably, no acute distress HEENT: no scleral icterus, pupils equal round and reactive, no palptable cervical adenopathy,  CV: RRR, no m/r/g  no jvd Resp: Clear to auscultation bilaterally GI: abdomen is soft, non-tender, non-distended, normal bowel sounds, no hepatosplenomegaly MSK: extremities are warm, no edema.  Skin: warm, no rash Neuro:  no focal deficits Psych: appropriate affect   Diagnostic Studies     Assessment and Plan   1. Abnormal EKG - suggestion of anteroseptal Qwaves, today's EKG also has poor R wave progression - no signficant cardiopulmonary symptoms - we will obtain echo to further evaluate   F/u pending echo  results     Arnoldo Lenis, M.D.

## 2017-05-02 NOTE — Patient Instructions (Signed)
Medication Instructions:  Your physician recommends that you continue on your current medications as directed. Please refer to the Current Medication list given to you today.   Labwork: NONE  Testing/Procedures: Your physician has requested that you have an echocardiogram. Echocardiography is a painless test that uses sound waves to create images of your heart. It provides your doctor with information about the size and shape of your heart and how well your heart's chambers and valves are working. This procedure takes approximately one hour. There are no restrictions for this procedure.    Follow-Up: Your physician recommends that you schedule a follow-up appointment in: To be determined based on test, will call with results    Any Other Special Instructions Will Be Listed Below (If Applicable).     If you need a refill on your cardiac medications before your next appointment, please call your pharmacy.

## 2017-05-03 ENCOUNTER — Telehealth: Payer: Self-pay | Admitting: Gastroenterology

## 2017-05-03 ENCOUNTER — Ambulatory Visit (HOSPITAL_COMMUNITY)
Admission: RE | Admit: 2017-05-03 | Discharge: 2017-05-03 | Disposition: A | Discharge status: Home / Self Care | Payer: BLUE CROSS/BLUE SHIELD | Source: Ambulatory Visit | Attending: Cardiology | Admitting: Cardiology

## 2017-05-03 DIAGNOSIS — R9431 Abnormal electrocardiogram [ECG] [EKG]: Secondary | ICD-10-CM | POA: Diagnosis not present

## 2017-05-03 DIAGNOSIS — Z87891 Personal history of nicotine dependence: Secondary | ICD-10-CM | POA: Insufficient documentation

## 2017-05-03 NOTE — Telephone Encounter (Signed)
Please call pt. HE had MORE THAN 3 simple adenomas removed from HIS colon. FOLLOW A HIGH FIBER DIET. NEXT TCS IN 3 YEARS.

## 2017-05-03 NOTE — Progress Notes (Signed)
*  PRELIMINARY RESULTS* Echocardiogram 2D Echocardiogram has been performed.  Leavy Cella 05/03/2017, 2:19 PM

## 2017-05-06 ENCOUNTER — Encounter: Payer: Self-pay | Admitting: Cardiology

## 2017-05-06 NOTE — Telephone Encounter (Signed)
Pt is aware. He said Dr. Oneida Alar mentioned something about hemorrhoids but he is not sure exactly what she said. The medicine he was prescribed for constipation was not covered by his insurance. He is taking Miralax and wants to know how often he should take it. He said his wife was going to get him some colace also to take. Dr. Oneida Alar, please advise!

## 2017-05-06 NOTE — Telephone Encounter (Signed)
PT is aware.

## 2017-05-06 NOTE — Telephone Encounter (Signed)
PLEASE CALL PT. USE PREPARATION H FOUR TIMES  A DAY IF NEEDED TO RELIEVE RECTAL PAIN/PRESSURE/BLEEDING. START MILK OF MAGNESIA PILLS OR LIQUID THREE TIMES A DAY TO REDUCE CONSTIPATION. ONLY USE MIRALX IF NEEDED TO TREAT CONSTIPATION.

## 2017-05-06 NOTE — Telephone Encounter (Signed)
ON RECALL  °

## 2017-09-26 ENCOUNTER — Encounter: Payer: Self-pay | Admitting: Gastroenterology

## 2020-04-11 ENCOUNTER — Encounter: Payer: Self-pay | Admitting: Internal Medicine

## 2021-11-13 ENCOUNTER — Encounter (HOSPITAL_COMMUNITY): Payer: Self-pay | Admitting: Emergency Medicine

## 2021-11-13 ENCOUNTER — Emergency Department (HOSPITAL_COMMUNITY)
Admission: EM | Admit: 2021-11-13 | Discharge: 2021-11-14 | Disposition: A | Discharge status: Home / Self Care | Payer: BLUE CROSS/BLUE SHIELD | Attending: Emergency Medicine | Admitting: Emergency Medicine

## 2021-11-13 DIAGNOSIS — R109 Unspecified abdominal pain: Secondary | ICD-10-CM | POA: Diagnosis present

## 2021-11-13 DIAGNOSIS — N132 Hydronephrosis with renal and ureteral calculous obstruction: Secondary | ICD-10-CM | POA: Insufficient documentation

## 2021-11-13 DIAGNOSIS — N200 Calculus of kidney: Secondary | ICD-10-CM

## 2021-11-13 LAB — URINALYSIS, ROUTINE W REFLEX MICROSCOPIC
Bacteria, UA: NONE SEEN
Bilirubin Urine: NEGATIVE
Glucose, UA: NEGATIVE mg/dL
Ketones, ur: NEGATIVE mg/dL
Leukocytes,Ua: NEGATIVE
Nitrite: NEGATIVE
Protein, ur: NEGATIVE mg/dL
Specific Gravity, Urine: 1.015 (ref 1.005–1.030)
pH: 6 (ref 5.0–8.0)

## 2021-11-13 LAB — COMPREHENSIVE METABOLIC PANEL
ALT: 19 U/L (ref 0–44)
AST: 18 U/L (ref 15–41)
Albumin: 4.2 g/dL (ref 3.5–5.0)
Alkaline Phosphatase: 88 U/L (ref 38–126)
Anion gap: 5 (ref 5–15)
BUN: 16 mg/dL (ref 8–23)
CO2: 27 mmol/L (ref 22–32)
Calcium: 9.3 mg/dL (ref 8.9–10.3)
Chloride: 108 mmol/L (ref 98–111)
Creatinine, Ser: 0.88 mg/dL (ref 0.61–1.24)
GFR, Estimated: >60 mL/min (ref >60)
Glucose, Bld: 108 mg/dL — ABNORMAL HIGH (ref 70–99)
Potassium: 4.4 mmol/L (ref 3.5–5.1)
Sodium: 140 mmol/L (ref 135–145)
Total Bilirubin: 0.8 mg/dL (ref 0.3–1.2)
Total Protein: 7.1 g/dL (ref 6.5–8.1)

## 2021-11-13 LAB — CBC
HCT: 40.5 % (ref 39.0–52.0)
Hemoglobin: 13.8 g/dL (ref 13.0–17.0)
MCH: 33 pg (ref 26.0–34.0)
MCHC: 34.1 g/dL (ref 30.0–36.0)
MCV: 96.9 fL (ref 80.0–100.0)
Platelets: 262 10*3/uL (ref 150–400)
RBC: 4.18 MIL/uL — ABNORMAL LOW (ref 4.22–5.81)
RDW: 14.2 % (ref 11.5–15.5)
WBC: 8.3 10*3/uL (ref 4.0–10.5)
nRBC: 0 % (ref 0.0–0.2)

## 2021-11-13 LAB — LIPASE, BLOOD: Lipase: 30 U/L (ref 11–51)

## 2021-11-13 MED ORDER — MORPHINE SULFATE (PF) 4 MG/ML IV SOLN
4.0000 mg | Freq: Once | INTRAVENOUS | Status: AC
  Administered 2021-11-14: 4 mg via INTRAVENOUS
  Filled 2021-11-13: qty 1

## 2021-11-13 MED ORDER — ONDANSETRON HCL 4 MG/2ML IJ SOLN
4.0000 mg | Freq: Once | INTRAMUSCULAR | Status: AC
  Administered 2021-11-14: 4 mg via INTRAVENOUS
  Filled 2021-11-13: qty 2

## 2021-11-13 NOTE — ED Triage Notes (Signed)
Pt arrives POV c/o mid abd pain since yesterday. Pt states he went to work this morning and was able to tolerate the pain until he got home this afternoon and drank some water and the pain increased.   Pt states he took a pain pill on his way to the hospital.

## 2021-11-14 ENCOUNTER — Emergency Department (HOSPITAL_COMMUNITY): Payer: BLUE CROSS/BLUE SHIELD

## 2021-11-14 MED ORDER — TAMSULOSIN HCL 0.4 MG PO CAPS: 0.4000 mg | ORAL_CAPSULE | Freq: Every day | ORAL | 0 refills | Status: AC

## 2021-11-14 MED ORDER — IOHEXOL 300 MG/ML  SOLN
100.0000 mL | Freq: Once | INTRAMUSCULAR | Status: AC | PRN
  Administered 2021-11-14: 100 mL via INTRAVENOUS

## 2021-11-14 MED ORDER — IBUPROFEN 400 MG PO TABS: 400.0000 mg | ORAL_TABLET | Freq: Four times a day (QID) | ORAL | 0 refills | Status: AC | PRN

## 2021-11-14 NOTE — ED Provider Notes (Signed)
[facility] EMERGENCY DEPARTMENT Provider Note   CSN: [id number] Arrival date & time: 11/13/21  2103     History  Chief Complaint  Patient presents with   Abdominal Pain    Brian Russo is a 66 y.o. male.  HPI     This is a 66 year old male who presents with abdominal pain.  Patient reports he has had abdominal pain over the last 24 hours.  He reports its in the mid abdomen.  He also has pain in his back.  He takes chronic pain medication and took some prior to arrival.  He reports some urinary frequency but no dysuria.  No hematuria.  Denies nausea or vomiting.   Home Medications Prior to Admission medications   Medication Sig Start Date End Date Taking? Authorizing Provider  ibuprofen (ADVIL) 400 MG tablet Take 1 tablet (400 mg total) by mouth every 6 (six) hours as needed. 11/14/21  Yes Charlesa Ehle, Barbette Hair, MD  tamsulosin (FLOMAX) 0.4 MG CAPS capsule Take 1 capsule (0.4 mg total) by mouth daily. 11/14/21  Yes Shariece Viveiros, Barbette Hair, MD  allopurinol (ZYLOPRIM) 100 MG tablet Take 2 tablets by mouth daily. 03/25/17   [provider]  atorvastatin (LIPITOR) 40 MG tablet Take 1 tablet by mouth daily. 03/22/17   [provider]  HYDROcodone-acetaminophen (NORCO) 10-325 MG tablet Take 1 tablet by mouth every 4 (four) hours as needed for moderate pain.     [provider]  tamsulosin (FLOMAX) 0.4 MG CAPS capsule Take 1 capsule by mouth daily. 03/22/17   [provider]      Allergies    Patient has no known allergies.    Review of Systems   Review of Systems  Constitutional:  Negative for fever.  Gastrointestinal:  Positive for abdominal pain. Negative for nausea and vomiting.  All other systems reviewed and are negative.   Physical Exam Updated Vital Signs BP 120/81   Pulse (!) 52   Temp 98.2 F (36.8 C)   Resp 18   Ht 1.702 m ('5\' 7"'$ )   Wt 108.9 kg   SpO2 99%   BMI 37.59 kg/m  Physical Exam Vitals and nursing note reviewed.   Constitutional:      Appearance: He is well-developed. He is not ill-appearing.  HENT:     Head: Normocephalic and atraumatic.  Cardiovascular:     Rate and Rhythm: Normal rate and regular rhythm.     Heart sounds: Normal heart sounds. No murmur heard. Pulmonary:     Effort: Pulmonary effort is normal. No respiratory distress.     Breath sounds: Normal breath sounds. No wheezing.  Abdominal:     General: Bowel sounds are normal.     Palpations: Abdomen is soft.     Tenderness: There is no abdominal tenderness. There is no right CVA tenderness, left CVA tenderness or rebound.  Musculoskeletal:     Cervical back: Neck supple.  Lymphadenopathy:     Cervical: No cervical adenopathy.  Skin:    General: Skin is warm and dry.  Neurological:     Mental Status: He is alert and oriented to person, place, and time.  Psychiatric:        Mood and Affect: Mood normal.     ED Results / Procedures / Treatments   Labs (all labs ordered are listed, but only abnormal results are displayed) Labs Reviewed  COMPREHENSIVE METABOLIC PANEL - Abnormal; Notable for the following components:      Result Value   Glucose, Bld  108 (*)    All other components within normal limits  CBC - Abnormal; Notable for the following components:   RBC 4.18 (*)    All other components within normal limits  URINALYSIS, ROUTINE W REFLEX MICROSCOPIC - Abnormal; Notable for the following components:   Hgb urine dipstick MODERATE (*)    All other components within normal limits  LIPASE, BLOOD    EKG None  Radiology CT ABDOMEN PELVIS W CONTRAST  Result Date: 11/14/2021 CLINICAL DATA:  Mid abdominal pain. EXAM: CT ABDOMEN AND PELVIS WITH CONTRAST TECHNIQUE: Multidetector CT imaging of the abdomen and pelvis was performed using the standard protocol following bolus administration of intravenous contrast. RADIATION DOSE REDUCTION: This exam was performed according to the departmental dose-optimization program which  includes automated exposure control, adjustment of the mA and/or kV according to patient size and/or use of iterative reconstruction technique. CONTRAST:  120m OMNIPAQUE IOHEXOL 300 MG/ML  SOLN COMPARISON:  None Available. FINDINGS: Lower chest: No acute abnormality. Hepatobiliary: No focal liver abnormality is seen. No gallstones, gallbladder wall thickening, or biliary dilatation. Pancreas: Unremarkable. No pancreatic ductal dilatation or surrounding inflammatory changes. Spleen: Normal in size without focal abnormality. Adrenals/Urinary Tract: Adrenal glands are unremarkable. Kidneys are normal in size without focal lesions. A 2 mm obstructing renal calculus is seen at the right UVJ, with mild right-sided hydronephrosis and hydroureter. Bladder is unremarkable. Stomach/Bowel: Stomach is within normal limits. Appendix appears normal. No evidence of bowel wall thickening, distention, or inflammatory changes. Noninflamed diverticula are seen throughout the sigmoid colon. Vascular/Lymphatic: Aortic atherosclerosis. No enlarged abdominal or pelvic lymph nodes. Reproductive: There is mild prostatomegaly. Other: A 4.1 cm x 1.7 cm fat containing left inguinal hernia is seen. No abdominopelvic ascites. Musculoskeletal: Degenerative changes seen throughout the lumbar spine. IMPRESSION: 1. 2 mm obstructing renal calculus at the right UVJ. 2. Sigmoid diverticulosis. 3. Fat-containing left inguinal hernia. 4. Aortic atherosclerosis. Aortic Atherosclerosis (ICD10-I70.0). Electronically Signed   By: TVirgina NorfolkM.D.   On: 11/14/2021 00:44    Procedures Procedures    Medications Ordered in ED Medications  morphine (PF) 4 MG/ML injection 4 mg (4 mg Intravenous Given 11/14/21 0006)  ondansetron (ZOFRAN) injection 4 mg (4 mg Intravenous Given 11/14/21 0004)  iohexol (OMNIPAQUE) 300 MG/ML solution 100 mL (100 mLs Intravenous Contrast Given 11/14/21 0014)    ED Course/ Medical Decision Making/ A&P                            Medical Decision Making Amount and/or Complexity of Data Reviewed Labs: ordered. Radiology: ordered.  Risk Prescription drug management.   This patient presents to the ED for concern of abdominal pain, this involves an extensive number of treatment options, and is a complaint that carries with it a high risk of complications and morbidity.  I considered the following differential and admission for this acute, potentially life threatening condition.  The differential diagnosis includes obstruction, appendicitis, pyelonephritis, kidney stone, colitis  MDM:    This is a 66year old male who presents with abdominal pain.  He is nontoxic and vital signs are reassuring.  He is relatively nontender but pain has not been relieved with pain medication at home.  Labs obtained and reviewed and largely reassuring.  No leukocytosis.  No significant metabolic derangements.  Given his age, will obtain CT scan of the abdomen.  CT shows an obstructing stone.  This likely explains his urinary symptoms.  Patient improved after pain and nausea  medication.  We discussed supportive measures and follow-up with urology.  He will be started on Flomax.  (Labs, imaging, consults)  Labs: I Ordered, and personally interpreted labs.  The pertinent results include: CBC, BMP, urinalysis  Imaging Studies ordered: I ordered imaging studies including CT abdomen pelvis I independently visualized and interpreted imaging. I agree with the radiologist interpretation  Additional history obtained from family at bedside.  External records from outside source obtained and reviewed including prior evaluations  Cardiac Monitoring: The patient was maintained on a cardiac monitor.  I personally viewed and interpreted the cardiac monitored which showed an underlying rhythm of: Normal sinus rhythm  Reevaluation: After the interventions noted above, I reevaluated the patient and found that they have :improved  Social  Determinants of Health: Lives independently  Disposition: Discharge  Co morbidities that complicate the patient evaluation  Past Medical History:  Diagnosis Date   Arthritis    Back pain    BPH (benign prostatic hyperplasia)    Gout    Hyperlipemia      Medicines Meds ordered this encounter  Medications   morphine (PF) 4 MG/ML injection 4 mg   ondansetron (ZOFRAN) injection 4 mg   iohexol (OMNIPAQUE) 300 MG/ML solution 100 mL   tamsulosin (FLOMAX) 0.4 MG CAPS capsule    Sig: Take 1 capsule (0.4 mg total) by mouth daily.    Dispense:  15 capsule    Refill:  0   ibuprofen (ADVIL) 400 MG tablet    Sig: Take 1 tablet (400 mg total) by mouth every 6 (six) hours as needed.    Dispense:  30 tablet    Refill:  0    For 3-5 days    I have reviewed the patients home medicines and have made adjustments as needed  Problem List / ED Course: Problem List Items Addressed This Visit   None Visit Diagnoses     Kidney stone    -  Primary   Relevant Medications   morphine (PF) 4 MG/ML injection 4 mg (Completed)                   Final Clinical Impression(s) / ED Diagnoses Final diagnoses:  Kidney stone    Rx / DC Orders ED Discharge Orders          Ordered    tamsulosin (FLOMAX) 0.4 MG CAPS capsule  Daily        11/14/21 0158    ibuprofen (ADVIL) 400 MG tablet  Every 6 hours PRN       Note to Pharmacy: For 3-5 days   11/14/21 0158              Merryl Hacker, MD 11/14/21 0201

## 2021-11-14 NOTE — Discharge Instructions (Signed)
You were seen today for abdominal pain.  Your work-up is notable for a kidney stone.  Make sure to stay hydrated.  Take medications as prescribed.  Follow-up with urology.  If you develop fevers or worsening symptoms, you should be reevaluated.
